# Patient Record
Sex: Female | Born: 1945
Health system: Southern US, Community
[De-identification: ages and names within clinical notes are randomized; demographics above are authoritative.]

## PROBLEM LIST (undated history)

## (undated) DIAGNOSIS — N83201 Unspecified ovarian cyst, right side: Secondary | ICD-10-CM

## (undated) DIAGNOSIS — Z8742 Personal history of other diseases of the female genital tract: Secondary | ICD-10-CM

## (undated) DIAGNOSIS — D649 Anemia, unspecified: Secondary | ICD-10-CM

## (undated) DIAGNOSIS — N83202 Unspecified ovarian cyst, left side: Secondary | ICD-10-CM

## (undated) DIAGNOSIS — T4145XA Adverse effect of unspecified anesthetic, initial encounter: Secondary | ICD-10-CM

## (undated) DIAGNOSIS — M1990 Unspecified osteoarthritis, unspecified site: Secondary | ICD-10-CM

## (undated) DIAGNOSIS — Z9889 Other specified postprocedural states: Secondary | ICD-10-CM

## (undated) DIAGNOSIS — N201 Calculus of ureter: Secondary | ICD-10-CM

## (undated) DIAGNOSIS — Z87442 Personal history of urinary calculi: Secondary | ICD-10-CM

## (undated) DIAGNOSIS — T8859XA Other complications of anesthesia, initial encounter: Secondary | ICD-10-CM

## (undated) DIAGNOSIS — R112 Nausea with vomiting, unspecified: Secondary | ICD-10-CM

## (undated) HISTORY — PX: APPENDECTOMY: SHX54

## (undated) HISTORY — PX: HERNIA REPAIR: SHX51

## (undated) HISTORY — PX: CATARACT EXTRACTION, BILATERAL: SHX1313

## (undated) HISTORY — PX: WISDOM TOOTH EXTRACTION: SHX21

## (undated) HISTORY — PX: LAPAROSCOPY: SHX197

## (undated) HISTORY — PX: BLEPHAROPLASTY: SUR158

## (undated) HISTORY — PX: KNEE ARTHROSCOPY W/ MENISCAL REPAIR: SHX1877

---

## 2000-10-07 ENCOUNTER — Other Ambulatory Visit: Admission: RE | Admit: 2000-10-07 | Discharge: 2000-10-07 | Payer: Self-pay | Admitting: Obstetrics and Gynecology

## 2001-10-12 ENCOUNTER — Other Ambulatory Visit: Admission: RE | Admit: 2001-10-12 | Discharge: 2001-10-12 | Payer: Self-pay | Admitting: Obstetrics and Gynecology

## 2002-10-19 ENCOUNTER — Other Ambulatory Visit: Admission: RE | Admit: 2002-10-19 | Discharge: 2002-10-19 | Payer: Self-pay | Admitting: Obstetrics and Gynecology

## 2003-10-23 ENCOUNTER — Other Ambulatory Visit: Admission: RE | Admit: 2003-10-23 | Discharge: 2003-10-23 | Payer: Self-pay | Admitting: Obstetrics and Gynecology

## 2005-03-11 ENCOUNTER — Other Ambulatory Visit: Admission: RE | Admit: 2005-03-11 | Discharge: 2005-03-11 | Payer: Self-pay | Admitting: Obstetrics and Gynecology

## 2011-06-04 ENCOUNTER — Telehealth: Payer: Self-pay | Admitting: *Deleted

## 2011-06-04 NOTE — Telephone Encounter (Signed)
Opened in Error.

## 2013-02-28 ENCOUNTER — Other Ambulatory Visit: Payer: Self-pay | Admitting: Orthopedic Surgery

## 2013-03-03 NOTE — Pre-Procedure Instructions (Signed)
Veronica Phelps  03/03/2013   Your procedure is scheduled on:  Monday, February 16.  Report to [facility], Main Entrance [name] "[facility]" at 8:00 AM.  Call this number if you have problems the morning of surgery: [phone]   Remember:   Do not eat food or drink liquids after midnight Sunday, February 15.   Take these medicines the morning of surgery with A SIP OF WATER: -   Do not wear jewelry, make-up or nail polish.  Do not wear lotions, powders, or perfumes. You may wear deodorant.  Do not shave 48 hours prior to surgery.   Do not bring valuables to the hospital.  [facility] is not responsible for any belongings or valuables.               Contacts, dentures or bridgework may not be worn into surgery.  Leave suitcase in the car. After surgery it may be brought to your room.  For patients admitted to the hospital, discharge time is determined by your treatment team.                Special Instructions: Shower with CHG wash (Bactoshield) tonight and again in the am prior to arriving to hospital.   Please read over the following fact sheets that you were given: Pain Booklet, Coughing and Deep Breathing, Blood Transfusion Information and Surgical Site Infection Prevention

## 2013-03-04 ENCOUNTER — Encounter (HOSPITAL_COMMUNITY)
Admission: RE | Admit: 2013-03-04 | Discharge: 2013-03-04 | Disposition: A | Discharge status: Home / Self Care | Payer: Medicare Other | Source: Ambulatory Visit | Attending: Orthopedic Surgery | Admitting: Orthopedic Surgery

## 2013-03-04 ENCOUNTER — Encounter (HOSPITAL_COMMUNITY): Payer: Self-pay | Admitting: Pharmacy Technician

## 2013-03-04 ENCOUNTER — Encounter (HOSPITAL_COMMUNITY): Payer: Self-pay

## 2013-03-04 DIAGNOSIS — Z01818 Encounter for other preprocedural examination: Secondary | ICD-10-CM | POA: Insufficient documentation

## 2013-03-04 DIAGNOSIS — Z01812 Encounter for preprocedural laboratory examination: Secondary | ICD-10-CM | POA: Insufficient documentation

## 2013-03-04 HISTORY — DX: Other complications of anesthesia, initial encounter: T88.59XA

## 2013-03-04 HISTORY — DX: Adverse effect of unspecified anesthetic, initial encounter: T41.45XA

## 2013-03-04 HISTORY — DX: Unspecified osteoarthritis, unspecified site: M19.90

## 2013-03-04 LAB — COMPREHENSIVE METABOLIC PANEL
ALK PHOS: 100 U/L (ref 39–117)
ALT: 67 U/L — ABNORMAL HIGH (ref 0–35)
AST: 45 U/L — ABNORMAL HIGH (ref 0–37)
Albumin: 3.9 g/dL (ref 3.5–5.2)
BUN: 21 mg/dL (ref 6–23)
CHLORIDE: 104 meq/L (ref 96–112)
CO2: 24 mEq/L (ref 19–32)
CREATININE: 0.64 mg/dL (ref 0.50–1.10)
Calcium: 9.4 mg/dL (ref 8.4–10.5)
GFR calc non Af Amer: >90 mL/min (ref >90)
GLUCOSE: 80 mg/dL (ref 70–99)
POTASSIUM: 4 meq/L (ref 3.7–5.3)
Sodium: 142 mEq/L (ref 137–147)
Total Bilirubin: 0.3 mg/dL (ref 0.3–1.2)
Total Protein: 7.2 g/dL (ref 6.0–8.3)

## 2013-03-04 LAB — APTT: aPTT: 30 seconds (ref 24–37)

## 2013-03-04 LAB — CBC WITH DIFFERENTIAL/PLATELET
Basophils Absolute: 0 10*3/uL (ref 0.0–0.1)
Basophils Relative: 0 % (ref 0–1)
EOS ABS: 0.3 10*3/uL (ref 0.0–0.7)
Eosinophils Relative: 3 % (ref 0–5)
HEMATOCRIT: 42.9 % (ref 36.0–46.0)
HEMOGLOBIN: 15 g/dL (ref 12.0–15.0)
LYMPHS ABS: 1.6 10*3/uL (ref 0.7–4.0)
Lymphocytes Relative: 21 % (ref 12–46)
MCH: 32.9 pg (ref 26.0–34.0)
MCHC: 35 g/dL (ref 30.0–36.0)
MCV: 94.1 fL (ref 78.0–100.0)
MONOS PCT: 6 % (ref 3–12)
Monocytes Absolute: 0.5 10*3/uL (ref 0.1–1.0)
NEUTROS ABS: 5.3 10*3/uL (ref 1.7–7.7)
NEUTROS PCT: 69 % (ref 43–77)
Platelets: 266 10*3/uL (ref 150–400)
RBC: 4.56 MIL/uL (ref 3.87–5.11)
RDW: 13.2 % (ref 11.5–15.5)
WBC: 7.6 10*3/uL (ref 4.0–10.5)

## 2013-03-04 LAB — TYPE AND SCREEN
ABO/RH(D): B POS
Antibody Screen: NEGATIVE

## 2013-03-04 LAB — URINALYSIS, ROUTINE W REFLEX MICROSCOPIC
Bilirubin Urine: NEGATIVE
Glucose, UA: NEGATIVE mg/dL
HGB URINE DIPSTICK: NEGATIVE
KETONES UR: NEGATIVE mg/dL
NITRITE: NEGATIVE
PROTEIN: NEGATIVE mg/dL
Specific Gravity, Urine: 1.023 (ref 1.005–1.030)
Urobilinogen, UA: 0.2 mg/dL (ref 0.0–1.0)
pH: 5 (ref 5.0–8.0)

## 2013-03-04 LAB — SURGICAL PCR SCREEN
MRSA, PCR: NEGATIVE
Staphylococcus aureus: NEGATIVE

## 2013-03-04 LAB — PROTIME-INR
INR: 0.88 (ref 0.00–1.49)
PROTHROMBIN TIME: 11.8 s (ref 11.6–15.2)

## 2013-03-04 LAB — URINE MICROSCOPIC-ADD ON

## 2013-03-04 LAB — ABO/RH: ABO/RH(D): B POS

## 2013-03-06 LAB — URINE CULTURE: Colony Count: >=100000

## 2013-03-07 NOTE — Progress Notes (Signed)
Anesthesia Chart Review:  Patient is a 68 year old female scheduled for left TKA on 03/14/13 by Dr. Ronnie Derby.  History includes arthritis, non-smoker. She reported that it takes a while for her to awaken from anesthesia.  PCP is Dr. Melinda Crutch with [facility].  CXR on 03/04/13 showed no active cardiopulmonary disease.  Preoperative labs noted.  AST/ALT 45/67.  Results are less than two times above normal and PLT count and PT/PTT WNL, so I will not plan to repeat preoperatively.  Tylenol is on her med list.  She denied ETOH use and did not report a history of hepatitis.  EKG is still pending from [facility].  Short Stay nursing staff to follow-up and have me review if abnormal; otherwise I anticipate that she can proceed as planned.  Myra Gianotti, PA-C [facility] Short Stay Center/Anesthesiology Phone [phone] 03/07/2013 1:32 PM

## 2013-03-13 MED ORDER — BUPIVACAINE LIPOSOME 1.3 % IJ SUSP
20.0000 mL | Freq: Once | INTRAMUSCULAR | Status: DC
  Filled 2013-03-13: qty 20

## 2013-03-13 MED ORDER — CHLORHEXIDINE GLUCONATE 4 % EX LIQD
60.0000 mL | Freq: Once | CUTANEOUS | Status: DC
  Filled 2013-03-13: qty 60

## 2013-03-13 MED ORDER — TRANEXAMIC ACID 100 MG/ML IV SOLN
1000.0000 mg | INTRAVENOUS | Status: AC
  Administered 2013-03-14: 1000 mg via INTRAVENOUS
  Filled 2013-03-13: qty 10

## 2013-03-13 MED ORDER — CEFAZOLIN SODIUM-DEXTROSE 2-3 GM-% IV SOLR
2.0000 g | INTRAVENOUS | Status: AC
  Administered 2013-03-14: 2 g via INTRAVENOUS
  Filled 2013-03-13: qty 50

## 2013-03-13 MED ORDER — SODIUM CHLORIDE 0.9 % IV SOLN: INTRAVENOUS | Status: DC

## 2013-03-14 ENCOUNTER — Encounter (HOSPITAL_COMMUNITY): Payer: Self-pay | Admitting: Anesthesiology

## 2013-03-14 ENCOUNTER — Encounter (HOSPITAL_COMMUNITY): Payer: Medicare Other | Admitting: Vascular Surgery

## 2013-03-14 ENCOUNTER — Inpatient Hospital Stay (HOSPITAL_COMMUNITY)
Admission: RE | Admit: 2013-03-14 | Discharge: 2013-03-15 | DRG: 470 | Disposition: A | Discharge status: Home Health | Payer: Medicare Other | Source: Ambulatory Visit | Attending: Orthopedic Surgery | Admitting: Orthopedic Surgery

## 2013-03-14 ENCOUNTER — Encounter (HOSPITAL_COMMUNITY)
Admission: RE | Disposition: A | Discharge status: Home Health | Payer: Self-pay | Source: Ambulatory Visit | Attending: Orthopedic Surgery

## 2013-03-14 ENCOUNTER — Inpatient Hospital Stay (HOSPITAL_COMMUNITY): Payer: Medicare Other | Admitting: Anesthesiology

## 2013-03-14 ENCOUNTER — Other Ambulatory Visit: Payer: Self-pay | Admitting: Orthopedic Surgery

## 2013-03-14 DIAGNOSIS — Z888 Allergy status to other drugs, medicaments and biological substances status: Secondary | ICD-10-CM

## 2013-03-14 DIAGNOSIS — D62 Acute posthemorrhagic anemia: Secondary | ICD-10-CM | POA: Diagnosis not present

## 2013-03-14 DIAGNOSIS — Z96659 Presence of unspecified artificial knee joint: Secondary | ICD-10-CM

## 2013-03-14 DIAGNOSIS — M1710 Unilateral primary osteoarthritis, unspecified knee: Secondary | ICD-10-CM | POA: Diagnosis present

## 2013-03-14 DIAGNOSIS — M25569 Pain in unspecified knee: Secondary | ICD-10-CM | POA: Diagnosis present

## 2013-03-14 HISTORY — PX: TOTAL KNEE ARTHROPLASTY: SHX125

## 2013-03-14 LAB — CREATININE, SERUM
Creatinine, Ser: 0.69 mg/dL (ref 0.50–1.10)
GFR calc Af Amer: >90 mL/min (ref >90)
GFR calc non Af Amer: 89 mL/min — ABNORMAL LOW (ref >90)

## 2013-03-14 LAB — CBC
HEMATOCRIT: 39.4 % (ref 36.0–46.0)
Hemoglobin: 13.5 g/dL (ref 12.0–15.0)
MCH: 32.2 pg (ref 26.0–34.0)
MCHC: 34.3 g/dL (ref 30.0–36.0)
MCV: 94 fL (ref 78.0–100.0)
Platelets: 290 10*3/uL (ref 150–400)
RBC: 4.19 MIL/uL (ref 3.87–5.11)
RDW: 13.2 % (ref 11.5–15.5)
WBC: 12 10*3/uL — ABNORMAL HIGH (ref 4.0–10.5)

## 2013-03-14 SURGERY — ARTHROPLASTY, KNEE, TOTAL
Anesthesia: General | Site: Knee | Laterality: Left

## 2013-03-14 MED ORDER — ENOXAPARIN SODIUM 30 MG/0.3ML ~~LOC~~ SOLN
30.0000 mg | Freq: Two times a day (BID) | SUBCUTANEOUS | Status: DC
  Administered 2013-03-15: 30 mg via SUBCUTANEOUS
  Filled 2013-03-14 (×3): qty 0.3

## 2013-03-14 MED ORDER — ACETAMINOPHEN 325 MG PO TABS: 650.0000 mg | ORAL_TABLET | Freq: Four times a day (QID) | ORAL | Status: DC | PRN

## 2013-03-14 MED ORDER — MENTHOL 3 MG MT LOZG: 1.0000 | LOZENGE | OROMUCOSAL | Status: DC | PRN

## 2013-03-14 MED ORDER — DEXTROSE 5 % IV SOLN
500.0000 mg | Freq: Four times a day (QID) | INTRAVENOUS | Status: DC | PRN
  Filled 2013-03-14: qty 5

## 2013-03-14 MED ORDER — METOCLOPRAMIDE HCL 10 MG PO TABS: 5.0000 mg | ORAL_TABLET | Freq: Three times a day (TID) | ORAL | Status: DC | PRN

## 2013-03-14 MED ORDER — BUPIVACAINE-EPINEPHRINE (PF) 0.5% -1:200000 IJ SOLN
INTRAMUSCULAR | Status: AC
  Filled 2013-03-14: qty 10

## 2013-03-14 MED ORDER — CELECOXIB 200 MG PO CAPS
200.0000 mg | ORAL_CAPSULE | Freq: Two times a day (BID) | ORAL | Status: DC
  Administered 2013-03-14 – 2013-03-15 (×2): 200 mg via ORAL
  Filled 2013-03-14 (×3): qty 1

## 2013-03-14 MED ORDER — METHOCARBAMOL 500 MG PO TABS
500.0000 mg | ORAL_TABLET | Freq: Four times a day (QID) | ORAL | Status: DC | PRN
  Administered 2013-03-14 – 2013-03-15 (×2): 500 mg via ORAL
  Filled 2013-03-14 (×2): qty 1

## 2013-03-14 MED ORDER — ALUM & MAG HYDROXIDE-SIMETH 200-200-20 MG/5ML PO SUSP: 30.0000 mL | ORAL | Status: DC | PRN

## 2013-03-14 MED ORDER — PHENOL 1.4 % MT LIQD: 1.0000 | OROMUCOSAL | Status: DC | PRN

## 2013-03-14 MED ORDER — CHLORHEXIDINE GLUCONATE 4 % EX LIQD
60.0000 mL | Freq: Once | CUTANEOUS | Status: DC
  Filled 2013-03-14: qty 60

## 2013-03-14 MED ORDER — HYDROCODONE-ACETAMINOPHEN 10-325 MG PO TABS
1.0000 | ORAL_TABLET | ORAL | Status: DC | PRN
  Administered 2013-03-14: 1 via ORAL
  Administered 2013-03-15: 2 via ORAL
  Administered 2013-03-15: 1 via ORAL
  Filled 2013-03-14: qty 1
  Filled 2013-03-14: qty 2
  Filled 2013-03-14: qty 1

## 2013-03-14 MED ORDER — HYDROMORPHONE HCL PF 1 MG/ML IJ SOLN
0.2500 mg | INTRAMUSCULAR | Status: DC | PRN
  Administered 2013-03-14 (×2): 0.25 mg via INTRAVENOUS

## 2013-03-14 MED ORDER — PROPOFOL 10 MG/ML IV BOLUS
INTRAVENOUS | Status: AC
  Filled 2013-03-14: qty 20

## 2013-03-14 MED ORDER — PROPOFOL 10 MG/ML IV BOLUS
INTRAVENOUS | Status: DC | PRN
  Administered 2013-03-14: 200 mg via INTRAVENOUS

## 2013-03-14 MED ORDER — DOCUSATE SODIUM 100 MG PO CAPS
100.0000 mg | ORAL_CAPSULE | Freq: Two times a day (BID) | ORAL | Status: DC
  Administered 2013-03-14 – 2013-03-15 (×2): 100 mg via ORAL
  Filled 2013-03-14 (×2): qty 1

## 2013-03-14 MED ORDER — ACETAMINOPHEN 650 MG RE SUPP: 650.0000 mg | Freq: Four times a day (QID) | RECTAL | Status: DC | PRN

## 2013-03-14 MED ORDER — SENNOSIDES-DOCUSATE SODIUM 8.6-50 MG PO TABS: 1.0000 | ORAL_TABLET | Freq: Every evening | ORAL | Status: DC | PRN

## 2013-03-14 MED ORDER — BUPIVACAINE LIPOSOME 1.3 % IJ SUSP
INTRAMUSCULAR | Status: DC | PRN
  Administered 2013-03-14: 20 mL

## 2013-03-14 MED ORDER — SODIUM CHLORIDE 0.9 % IV SOLN
INTRAVENOUS | Status: DC
  Administered 2013-03-14: 75 mL/h via INTRAVENOUS
  Administered 2013-03-15: 06:00:00 via INTRAVENOUS

## 2013-03-14 MED ORDER — HYDROMORPHONE HCL PF 1 MG/ML IJ SOLN
INTRAMUSCULAR | Status: AC
  Administered 2013-03-14: 0.25 mg via INTRAVENOUS
  Filled 2013-03-14: qty 1

## 2013-03-14 MED ORDER — DIPHENHYDRAMINE HCL 12.5 MG/5ML PO ELIX: 12.5000 mg | ORAL_SOLUTION | ORAL | Status: DC | PRN

## 2013-03-14 MED ORDER — SODIUM CHLORIDE 0.9 % IR SOLN
Status: DC | PRN
  Administered 2013-03-14 (×2): 1000 mL

## 2013-03-14 MED ORDER — ONDANSETRON HCL 4 MG PO TABS: 4.0000 mg | ORAL_TABLET | Freq: Four times a day (QID) | ORAL | Status: DC | PRN

## 2013-03-14 MED ORDER — HYDROMORPHONE HCL PF 1 MG/ML IJ SOLN
1.0000 mg | INTRAMUSCULAR | Status: DC | PRN
  Administered 2013-03-14 (×2): 1 mg via INTRAVENOUS
  Filled 2013-03-14 (×2): qty 1

## 2013-03-14 MED ORDER — FENTANYL CITRATE 0.05 MG/ML IJ SOLN
INTRAMUSCULAR | Status: AC
  Administered 2013-03-14: 100 ug
  Filled 2013-03-14: qty 2

## 2013-03-14 MED ORDER — BISACODYL 5 MG PO TBEC: 5.0000 mg | DELAYED_RELEASE_TABLET | Freq: Every day | ORAL | Status: DC | PRN

## 2013-03-14 MED ORDER — ONDANSETRON HCL 4 MG/2ML IJ SOLN
4.0000 mg | Freq: Four times a day (QID) | INTRAMUSCULAR | Status: DC | PRN
  Administered 2013-03-14 – 2013-03-15 (×3): 4 mg via INTRAVENOUS
  Filled 2013-03-14 (×3): qty 2

## 2013-03-14 MED ORDER — ONDANSETRON HCL 4 MG/2ML IJ SOLN
INTRAMUSCULAR | Status: DC | PRN
  Administered 2013-03-14: 4 mg via INTRAVENOUS

## 2013-03-14 MED ORDER — FENTANYL CITRATE 0.05 MG/ML IJ SOLN
INTRAMUSCULAR | Status: AC
  Filled 2013-03-14: qty 5

## 2013-03-14 MED ORDER — FLEET ENEMA 7-19 GM/118ML RE ENEM: 1.0000 | ENEMA | Freq: Once | RECTAL | Status: AC | PRN

## 2013-03-14 MED ORDER — METOCLOPRAMIDE HCL 5 MG/ML IJ SOLN
5.0000 mg | Freq: Three times a day (TID) | INTRAMUSCULAR | Status: DC | PRN
  Administered 2013-03-14: 10 mg via INTRAVENOUS
  Filled 2013-03-14: qty 2

## 2013-03-14 MED ORDER — LACTATED RINGERS IV SOLN
INTRAVENOUS | Status: DC | PRN
  Administered 2013-03-14 (×2): via INTRAVENOUS

## 2013-03-14 MED ORDER — LIDOCAINE HCL (CARDIAC) 20 MG/ML IV SOLN
INTRAVENOUS | Status: DC | PRN
  Administered 2013-03-14: 80 mg via INTRAVENOUS

## 2013-03-14 MED ORDER — LACTATED RINGERS IV SOLN
INTRAVENOUS | Status: DC
  Administered 2013-03-14: 08:00:00 via INTRAVENOUS

## 2013-03-14 MED ORDER — FENTANYL CITRATE 0.05 MG/ML IJ SOLN
INTRAMUSCULAR | Status: DC | PRN
  Administered 2013-03-14: 50 ug via INTRAVENOUS
  Administered 2013-03-14: 125 ug via INTRAVENOUS
  Administered 2013-03-14 (×3): 25 ug via INTRAVENOUS

## 2013-03-14 MED ORDER — CEFAZOLIN SODIUM-DEXTROSE 2-3 GM-% IV SOLR
2.0000 g | Freq: Four times a day (QID) | INTRAVENOUS | Status: AC
  Administered 2013-03-14 (×2): 2 g via INTRAVENOUS
  Filled 2013-03-14 (×2): qty 50

## 2013-03-14 MED ORDER — INFLUENZA VAC SPLIT QUAD 0.5 ML IM SUSP
0.5000 mL | INTRAMUSCULAR | Status: DC
  Filled 2013-03-14: qty 0.5

## 2013-03-14 MED ORDER — ONDANSETRON HCL 4 MG/2ML IJ SOLN: 4.0000 mg | Freq: Once | INTRAMUSCULAR | Status: DC | PRN

## 2013-03-14 MED ORDER — BUPIVACAINE-EPINEPHRINE 0.5% -1:200000 IJ SOLN
INTRAMUSCULAR | Status: DC | PRN
  Administered 2013-03-14: 30 mL

## 2013-03-14 SURGICAL SUPPLY — 55 items
BANDAGE ESMARK 6X9 LF (GAUZE/BANDAGES/DRESSINGS) ×1 IMPLANT
BLADE SAGITTAL 13X1.27X60 (BLADE) ×2 IMPLANT
BLADE SAGITTAL 13X1.27X60MM (BLADE) ×1
BLADE SAW SGTL 83.5X18.5 (BLADE) ×3 IMPLANT
BNDG CMPR 9X6 STRL LF SNTH (GAUZE/BANDAGES/DRESSINGS) ×1
BNDG ESMARK 6X9 LF (GAUZE/BANDAGES/DRESSINGS) ×3
BOWL SMART MIX CTS (DISPOSABLE) ×3 IMPLANT
CAP KNEE CMT PERSONA ×2 IMPLANT
CEMENT BONE SIMPLEX SPEEDSET (Cement) ×6 IMPLANT
COVER SURGICAL LIGHT HANDLE (MISCELLANEOUS) ×3 IMPLANT
CUFF TOURNIQUET SINGLE 34IN LL (TOURNIQUET CUFF) ×3 IMPLANT
DRAPE EXTREMITY T 121X128X90 (DRAPE) ×3 IMPLANT
DRAPE INCISE IOBAN 66X45 STRL (DRAPES) ×6 IMPLANT
DRAPE PROXIMA HALF (DRAPES) ×3 IMPLANT
DRAPE U-SHAPE 47X51 STRL (DRAPES) ×3 IMPLANT
DRSG ADAPTIC 3X8 NADH LF (GAUZE/BANDAGES/DRESSINGS) ×3 IMPLANT
DRSG PAD ABDOMINAL 8X10 ST (GAUZE/BANDAGES/DRESSINGS) ×3 IMPLANT
DURAPREP 26ML APPLICATOR (WOUND CARE) ×6 IMPLANT
ELECT REM PT RETURN 9FT ADLT (ELECTROSURGICAL) ×3
ELECTRODE REM PT RTRN 9FT ADLT (ELECTROSURGICAL) ×1 IMPLANT
EVACUATOR 1/8 PVC DRAIN (DRAIN) ×3 IMPLANT
GLOVE BIOGEL M 7.0 STRL (GLOVE) IMPLANT
GLOVE BIOGEL PI IND STRL 7.5 (GLOVE) IMPLANT
GLOVE BIOGEL PI IND STRL 8.5 (GLOVE) ×2 IMPLANT
GLOVE BIOGEL PI INDICATOR 7.5 (GLOVE)
GLOVE BIOGEL PI INDICATOR 8.5 (GLOVE) ×4
GLOVE SURG ORTHO 8.0 STRL STRW (GLOVE) ×6 IMPLANT
GOWN PREVENTION PLUS XLARGE (GOWN DISPOSABLE) ×6 IMPLANT
GOWN STRL NON-REIN LRG LVL3 (GOWN DISPOSABLE) ×6 IMPLANT
HANDPIECE INTERPULSE COAX TIP (DISPOSABLE) ×3
HOOD PEEL AWAY FACE SHEILD DIS (HOOD) ×12 IMPLANT
KIT BASIN OR (CUSTOM PROCEDURE TRAY) ×3 IMPLANT
KIT ROOM TURNOVER OR (KITS) ×3 IMPLANT
MANIFOLD NEPTUNE II (INSTRUMENTS) ×3 IMPLANT
NEEDLE 22X1 1/2 (OR ONLY) (NEEDLE) ×3 IMPLANT
NS IRRIG 1000ML POUR BTL (IV SOLUTION) ×3 IMPLANT
PACK TOTAL JOINT (CUSTOM PROCEDURE TRAY) ×3 IMPLANT
PAD ARMBOARD 7.5X6 YLW CONV (MISCELLANEOUS) ×6 IMPLANT
PADDING CAST COTTON 6X4 STRL (CAST SUPPLIES) ×3 IMPLANT
SET HNDPC FAN SPRY TIP SCT (DISPOSABLE) ×1 IMPLANT
SPONGE GAUZE 4X4 12PLY (GAUZE/BANDAGES/DRESSINGS) ×3 IMPLANT
SPONGE GAUZE 4X4 12PLY STER LF (GAUZE/BANDAGES/DRESSINGS) ×3 IMPLANT
STAPLER VISISTAT 35W (STAPLE) ×3 IMPLANT
SUCTION FRAZIER TIP 10 FR DISP (SUCTIONS) ×3 IMPLANT
SUT BONE WAX W31G (SUTURE) ×3 IMPLANT
SUT VIC AB 0 CTB1 27 (SUTURE) ×6 IMPLANT
SUT VIC AB 1 CT1 27 (SUTURE) ×9
SUT VIC AB 1 CT1 27XBRD ANBCTR (SUTURE) ×2 IMPLANT
SUT VIC AB 2-0 CT1 27 (SUTURE) ×6
SUT VIC AB 2-0 CT1 TAPERPNT 27 (SUTURE) ×2 IMPLANT
SYR CONTROL 10ML LL (SYRINGE) ×3 IMPLANT
TOWEL OR 17X24 6PK STRL BLUE (TOWEL DISPOSABLE) ×3 IMPLANT
TOWEL OR 17X26 10 PK STRL BLUE (TOWEL DISPOSABLE) ×3 IMPLANT
TRAY FOLEY CATH 14FR (SET/KITS/TRAYS/PACK) ×3 IMPLANT
WATER STERILE IRR 1000ML POUR (IV SOLUTION) ×6 IMPLANT

## 2013-03-14 NOTE — H&P (Signed)
  Veronica Phelps MRN:  [id number] DOB/SEX:  11/17/1945/female  CHIEF COMPLAINT:  Painful left Knee  HISTORY: Patient is a 68 y.o. female presented with a history of pain in the left knee. Onset of symptoms was gradual starting several years ago with gradually worsening course since that time. Prior procedures on the knee include arthroscopy. Patient has been treated conservatively with over-the-counter NSAIDs and activity modification. Patient currently rates pain in the knee at 9 out of 10 with activity. There is no pain at night.  PAST MEDICAL HISTORY: There are no active problems to display for this patient.  Past Medical History  Diagnosis Date  . Complication of anesthesia     takes awhile to wake up   . Arthritis     oa   Past Surgical History  Procedure Laterality Date  . Hernia repair      1970s x2   . Laparoscopy    . Knee arthroscopy w/ meniscal repair      left    11/2011     MEDICATIONS:   No prescriptions prior to admission    ALLERGIES:   Allergies  Allergen Reactions  . Oxycontin [Oxycodone Hcl] Other (See Comments)    Dizziness  . Ultram [Tramadol] Other (See Comments)    Dizziness, and headaches    REVIEW OF SYSTEMS:  Pertinent items are noted in HPI.   FAMILY HISTORY:  No family history on file.  SOCIAL HISTORY:   History  Substance Use Topics  . Smoking status: Never Smoker   . Smokeless tobacco: Not on file  . Alcohol Use: No     EXAMINATION:  Vital signs in last 24 hours:    General appearance: alert, cooperative and no distress Lungs: clear to auscultation bilaterally Heart: regular rate and rhythm, S1, S2 normal, no murmur, click, rub or gallop Abdomen: soft, non-tender; bowel sounds normal; no masses,  no organomegaly Extremities: extremities normal, atraumatic, no cyanosis or edema and Homans sign is negative, no sign of DVT Pulses: 2+ and symmetric  Musculoskeletal:  ROM 0-110, Ligaments intact,  Imaging Review Plain  radiographs demonstrate severe degenerative joint disease of the left knee. The overall alignment is mild valgus. The bone quality appears to be good for age and reported activity level.  Assessment/Plan: End stage arthritis, left knee   The patient history, physical examination and imaging studies are consistent with advanced degenerative joint disease of the left knee. The patient has failed conservative treatment.  The clearance notes were reviewed.  After discussion with the patient it was felt that Total Knee Replacement was indicated. The procedure,  risks, and benefits of total knee arthroplasty were presented and reviewed. The risks including but not limited to aseptic loosening, infection, blood clots, vascular injury, stiffness, patella tracking problems complications among others were discussed. The patient acknowledged the explanation, agreed to proceed with the plan.  Veronica Phelps 03/14/2013, 7:07 AM

## 2013-03-14 NOTE — Anesthesia Procedure Notes (Signed)
Anesthesia Regional Block:  Femoral nerve block  Pre-Anesthetic Checklist: ,, timeout performed, Correct Patient, Correct Site, Correct Laterality, Correct Procedure, Correct Position, site marked, Risks and benefits discussed,  Surgical consent,  Pre-op evaluation,  At surgeon's request and post-op pain management  Laterality: Left  Prep: Maximum Sterile Barrier Precautions used, chloraprep and alcohol swabs       Needles:  Injection technique: Single-shot  Needle Type: Stimulator Needle - 80          Additional Needles:  Procedures: nerve stimulator Femoral nerve block  Nerve Stimulator or Paresthesia:  Response: 0.5 mA, 0.1 ms, 5 cm  Additional Responses:   Narrative:  Start time: 03/14/2013 9:10 AM End time: 03/14/2013 9:15 AM Injection made incrementally with aspirations every 5 mL.  Performed by: Personally  Anesthesiologist: Sharolyn Douglas MD  Additional Notes: Pt accepts risks w/ the procedure. 0.5% Marcaine w/ epi w/o difficulty. Pt tolerated well. GES

## 2013-03-14 NOTE — Preoperative (Signed)
Beta Blockers   Reason not to administer Beta Blockers:Not Applicable 

## 2013-03-14 NOTE — Anesthesia Postprocedure Evaluation (Signed)
  Anesthesia Post-op Note  Patient: Veronica Phelps  Procedure(s) Performed: Procedure(s): LEFT TOTAL KNEE ARTHROPLASTY (Left)  Patient Location: PACU  Anesthesia Type:General and GA combined with regional for post-op pain  Level of Consciousness: awake, alert  and oriented  Airway and Oxygen Therapy: Patient Spontanous Breathing  Post-op Pain: mild  Post-op Assessment: Post-op Vital signs reviewed, Patient's Cardiovascular Status Stable, Respiratory Function Stable, Patent Airway and Pain level controlled  Post-op Vital Signs: stable  Complications: No apparent anesthesia complications

## 2013-03-14 NOTE — Care Management Note (Signed)
CARE MANAGEMENT NOTE 03/14/2013  Patient:  Veronica Phelps, Veronica Phelps   Account Number:  [id number]  Date Initiated:  03/14/2013  Documentation initiated by:  Ricki Miller  Subjective/Objective Assessment:   68 yr old female s/p left total knee arthroplasty.     Action/Plan:   PT/OT eval   Anticipated DC Date:  03/15/2013   Anticipated DC Plan:  [facility]         Choice offered to / List presented to:             Status of service:  In process, will continue to follow

## 2013-03-14 NOTE — Transfer of Care (Signed)
Immediate Anesthesia Transfer of Care Note  Patient: Veronica Phelps  Procedure(s) Performed: Procedure(s): LEFT TOTAL KNEE ARTHROPLASTY (Left)  Patient Location: PACU  Anesthesia Type:General  Level of Consciousness: awake and alert   Airway & Oxygen Therapy: Patient Spontanous Breathing and Patient connected to nasal cannula oxygen  Post-op Assessment: Report given to PACU RN, Post -op Vital signs reviewed and stable and Patient moving all extremities X 4  Post vital signs: Reviewed and stable  Complications: No apparent anesthesia complications

## 2013-03-14 NOTE — Anesthesia Preprocedure Evaluation (Signed)
Anesthesia Evaluation  Patient identified by MRN, date of birth, ID band Patient awake    Reviewed: Allergy & Precautions, H&P , NPO status , Patient's Chart, lab work & pertinent test results  Airway       Dental   Pulmonary          Cardiovascular     Neuro/Psych    GI/Hepatic   Endo/Other    Renal/GU      Musculoskeletal   Abdominal   Peds  Hematology   Anesthesia Other Findings   Reproductive/Obstetrics                           Anesthesia Physical Anesthesia Plan  ASA: I  Anesthesia Plan: General   Post-op Pain Management:    Induction: Intravenous  Airway Management Planned: LMA and Oral ETT  Additional Equipment:   Intra-op Plan:   Post-operative Plan: Extubation in OR  Informed Consent: I have reviewed the patients History and Physical, chart, labs and discussed the procedure including the risks, benefits and alternatives for the proposed anesthesia with the patient or authorized representative who has indicated his/her understanding and acceptance.     Plan Discussed with:   Anesthesia Plan Comments:         Anesthesia Quick Evaluation

## 2013-03-14 NOTE — Progress Notes (Signed)
Orthopedic Tech Progress Note Patient Details:  Veronica Phelps [birth year] [id number]  CPM Left Knee CPM Left Knee: On Left Knee Flexion (Degrees): 90 Left Knee Extension (Degrees): 0 Additional Comments: Trapeze bar and foot roll   Irish Elders 03/14/2013, 12:27 PM

## 2013-03-14 NOTE — Evaluation (Signed)
Physical Therapy Evaluation Patient Details Name: Veronica Phelps MRN: [id number] DOB: November 01, 1945 Today's Date: 03/14/2013 Time: 1324-4010 PT Time Calculation (min): 34 min  PT Assessment / Plan / Recommendation History of Present Illness  Pt is a 68 y/o female admitted s/p L TKA.  Clinical Impression  This patient presents with acute pain and decreased functional independence following the above mentioned procedure. At the time of PT eval, pt nauseated while sitting EOB and reports feeling shaky. Pt with 3 knee buckles while standing throughout session, and recliner was pulled up behind her rather than attempt ambulation at this time for safety. This patient is appropriate for skilled PT interventions to address functional limitations, improve safety and independence with functional mobility, and return to PLOF.     PT Assessment  Patient needs continued PT services    Follow Up Recommendations  Home health PT    Does the patient have the potential to tolerate intense rehabilitation      Barriers to Discharge        Equipment Recommendations  None recommended by PT    Recommendations for Other Services     Frequency 7X/week    Precautions / Restrictions Precautions Precautions: Fall;Knee Precaution Comments: Discussed towel roll under heel and NO pillow under knee.  Restrictions Weight Bearing Restrictions: No LLE Weight Bearing: Weight bearing as tolerated   Pertinent Vitals/Pain 7/10 after session. Pt declines calling RN for pain control.      Mobility  Bed Mobility Overal bed mobility: Needs Assistance Bed Mobility: Supine to Sit Supine to sit: Min assist;HOB elevated General bed mobility comments: VC's for technique and hand placement on bed rails for support. Assist for movement of LLE to EOB and for trunk stability as pt elevated to full sitting.  Transfers Overall transfer level: Needs assistance Equipment used: Rolling walker (2 wheeled) Transfers: Sit  to/from [facility] Sit to Stand: +2 physical assistance;Mod assist Stand pivot transfers: Min assist;+2 safety/equipment General transfer comment: VC's for hand placement on seated surface for safety. Assist for support for occasional knee buckle during SPT to [facility]. Pt also had knee buckle while standing from [facility] and recliner was brought behind her to sit instead of attempting steps to the recliner from the [facility].  Ambulation/Gait General Gait Details: NT due to knee instability at this time    Exercises Total Joint Exercises Ankle Circles/Pumps: 10 reps Quad Sets: 10 reps   PT Diagnosis: Difficulty walking;Acute pain  PT Problem List: Decreased strength;Decreased range of motion;Decreased activity tolerance;Decreased balance;Decreased mobility;Decreased knowledge of use of DME;Decreased safety awareness;Decreased knowledge of precautions;Pain PT Treatment Interventions: DME instruction;Gait training;Stair training;Functional mobility training;Therapeutic activities;Therapeutic exercise;Neuromuscular re-education;Patient/family education     PT Goals(Current goals can be found in the care plan section) Acute Rehab PT Goals Patient Stated Goal: To return home with husband PT Goal Formulation: With patient/family Time For Goal Achievement: 03/21/13 Potential to Achieve Goals: Good  Visit Information  Last PT Received On: 03/14/13 Assistance Needed: +2 History of Present Illness: Pt is a 68 y/o female admitted s/p L TKA.       Prior [place] expects to be discharged to:: Private residence Living Arrangements: Spouse/significant other Available Help at Discharge: Family;Available 24 hours/day Type of Home: House Home Access: Stairs to enter [employer] of Steps: 3 Entrance Stairs-Rails: Left;Right Home Layout: One level Home Equipment: Environmental consultant - 2 wheels;Bedside commode (Walk-in shower with 3-4 in lip to step over) Prior  Function Level of Independence: Independent Comments: Still  driving Communication Communication: No difficulties Dominant Hand: Left    Cognition  Cognition Arousal/Alertness: Lethargic Behavior During Therapy: Flat affect Overall Cognitive Status: Within Functional Limits for tasks assessed    Extremity/Trunk Assessment Upper Extremity Assessment Upper Extremity Assessment: Overall WFL for tasks assessed Lower Extremity Assessment Lower Extremity Assessment: LLE deficits/detail LLE Deficits / Details: Decreased strength and AROM consistent with TKA Cervical / Trunk Assessment Cervical / Trunk Assessment: Normal   Balance Balance Overall balance assessment: Needs assistance Sitting-balance support: Feet supported;Bilateral upper extremity supported Sitting balance-Leahy Scale: Fair Standing balance support: Bilateral upper extremity supported Standing balance-Leahy Scale: Poor  End of Session PT - End of Session Equipment Utilized During Treatment: Gait belt;Oxygen Activity Tolerance: Patient limited by fatigue;Patient limited by lethargy Patient left: in chair;with call bell/phone within reach;with family/visitor present Nurse Communication: Mobility status CPM Left Knee CPM Left Knee: Off Left Knee Flexion (Degrees): 90 Left Knee Extension (Degrees): 0 Additional Comments: Trapeze bar and foot roll  GP     Jolyn Lent 03/14/2013, 3:37 PM  Jolyn Lent, PT, DPT [phone]

## 2013-03-15 LAB — BASIC METABOLIC PANEL
BUN: 16 mg/dL (ref 6–23)
CALCIUM: 8.2 mg/dL — AB (ref 8.4–10.5)
CO2: 24 mEq/L (ref 19–32)
CREATININE: 0.69 mg/dL (ref 0.50–1.10)
Chloride: 102 mEq/L (ref 96–112)
GFR, EST NON AFRICAN AMERICAN: 89 mL/min — AB (ref >90)
GLUCOSE: 126 mg/dL — AB (ref 70–99)
POTASSIUM: 3.6 meq/L — AB (ref 3.7–5.3)
Sodium: 139 mEq/L (ref 137–147)

## 2013-03-15 LAB — CBC
HEMATOCRIT: 35.2 % — AB (ref 36.0–46.0)
Hemoglobin: 11.8 g/dL — ABNORMAL LOW (ref 12.0–15.0)
MCH: 31.6 pg (ref 26.0–34.0)
MCHC: 33.5 g/dL (ref 30.0–36.0)
MCV: 94.4 fL (ref 78.0–100.0)
Platelets: 274 10*3/uL (ref 150–400)
RBC: 3.73 MIL/uL — ABNORMAL LOW (ref 3.87–5.11)
RDW: 13.4 % (ref 11.5–15.5)
WBC: 8.8 10*3/uL (ref 4.0–10.5)

## 2013-03-15 MED ORDER — HYDROCODONE-ACETAMINOPHEN 10-325 MG PO TABS: 1.0000 | ORAL_TABLET | ORAL | Status: DC | PRN

## 2013-03-15 MED ORDER — ENOXAPARIN SODIUM 40 MG/0.4ML ~~LOC~~ SOLN: 40.0000 mg | SUBCUTANEOUS | Status: DC

## 2013-03-15 MED ORDER — METHOCARBAMOL 500 MG PO TABS: 500.0000 mg | ORAL_TABLET | Freq: Four times a day (QID) | ORAL | Status: DC | PRN

## 2013-03-15 MED ORDER — CELECOXIB 200 MG PO CAPS: 200.0000 mg | ORAL_CAPSULE | Freq: Two times a day (BID) | ORAL | Status: DC

## 2013-03-15 NOTE — Progress Notes (Signed)
Physical Therapy Treatment Patient Details Name: Veronica Phelps MRN: [id number] DOB: 1945-07-18 Today's Date: 03/15/2013 Time: 5366-4403 PT Time Calculation (min): 36 min  PT Assessment / Plan / Recommendation  History of Present Illness Pt is a 68 y/o female admitted s/p L TKA.   PT Comments   Pt progressing well towards physical therapy goals. Continues to demonstrate knee buckling and requires cueing for increased quad activation for safety. Will begin stair training in afternoon session.   Follow Up Recommendations  Home health PT     Does the patient have the potential to tolerate intense rehabilitation     Barriers to Discharge        Equipment Recommendations  None recommended by PT    Recommendations for Other Services    Frequency 7X/week   Progress towards PT Goals Progress towards PT goals: Progressing toward goals  Plan Current plan remains appropriate    Precautions / Restrictions Precautions Precautions: Fall;Knee Precaution Comments: Discussed towel roll under heel and NO pillow under knee.  Restrictions Weight Bearing Restrictions: Yes LLE Weight Bearing: Weight bearing as tolerated   Pertinent Vitals/Pain 3/10 at rest    Mobility  Bed Mobility Overal bed mobility: Needs Assistance Bed Mobility: Supine to Sit Supine to sit: Min assist;HOB elevated General bed mobility comments: VC's for sequencing and technique. Assist for movement of LLE to EOB, however pt was able to come into long sitting and scoot hips to transition well.  Transfers Overall transfer level: Needs assistance Equipment used: Rolling walker (2 wheeled) Transfers: Sit to/from Stand Sit to Stand: Min assist Stand pivot transfers: Min assist General transfer comment: VC's for hand placement on seated surface for safety. Assist to come to full standing, as well as for walker placement during SPT.  Ambulation/Gait Ambulation/Gait assistance: Min assist Ambulation Distance (Feet): 45  Feet Assistive device: Rolling walker (2 wheeled) Gait Pattern/deviations: Step-to pattern;Decreased stride length;Trunk flexed Gait velocity: Decreased Gait velocity interpretation: Below normal speed for age/gender General Gait Details: VC's for increased quad activation, increased heel strike, and fluidity of walker movement. Pt with one instance where knee buckled and pt required min assist to recover.     Exercises Total Joint Exercises Ankle Circles/Pumps: 10 reps Quad Sets: 10 reps Short Arc Quad: 10 reps;AAROM (Eccentric lower`) Heel Slides: 10 reps Hip ABduction/ADduction: 10 reps Goniometric ROM: 98 AROM flexion 107 AAROM flexion 5AROM extension   PT Diagnosis:    PT Problem List:   PT Treatment Interventions:     PT Goals (current goals can now be found in the care plan section) Acute Rehab PT Goals Patient Stated Goal: To return home with husband PT Goal Formulation: With patient/family Time For Goal Achievement: 03/21/13 Potential to Achieve Goals: Good  Visit Information  Last PT Received On: 03/15/13 Assistance Needed: +1 History of Present Illness: Pt is a 68 y/o female admitted s/p L TKA.    Subjective Data  Subjective: Pt agreeable to PT session and OOB.  Patient Stated Goal: To return home with husband   Cognition  Cognition Arousal/Alertness: Awake/alert Behavior During Therapy: Flat affect Overall Cognitive Status: Within Functional Limits for tasks assessed    Balance  Balance Overall balance assessment: Needs assistance Sitting-balance support: Feet supported;Bilateral upper extremity supported Sitting balance-Leahy Scale: Fair Standing balance support: Bilateral upper extremity supported Standing balance-Leahy Scale: Poor  End of Session PT - End of Session Equipment Utilized During Treatment: Gait belt Activity Tolerance: Patient tolerated treatment well Patient left: in chair;with call bell/phone within reach  Nurse Communication:  Mobility status CPM Left Knee CPM Left Knee: On   GP     Jolyn Lent 03/15/2013, 12:09 PM  Jolyn Lent, PT, DPT [phone]

## 2013-03-15 NOTE — Discharge Instructions (Signed)
Diet: As you were doing prior to hospitalization   Activity:  Increase activity slowly as tolerated                  No lifting or driving for 6 weeks  Shower:  May shower without a dressing once there is no drainage from your wound.                 Do NOT wash over the wound.                 Dressing:  You may change your dressing on Thursday                    Then change the dressing daily with sterile 4"x4"s gauze dressing                     And TED hose for knees.  Weight Bearing:  Weight bearing as tolerated as taught in physical therapy.  Use a                                walker or Crutches as instructed.  To prevent constipation: you may use a stool softener such as -               Colace ( over the counter) 100 mg by mouth twice a day                Drink plenty of fluids ( prune juice may be helpful) and high fiber foods                Miralax ( over the counter) for constipation as needed.    Precautions:  If you experience chest pain or shortness of breath - call 911 immediately               For transfer to the hospital emergency department!!               If you develop a fever greater that 101 F, purulent drainage from wound,                             increased redness or drainage from wound, or calf pain -- Call the office.  Follow- Up Appointment:  Please call for an appointment to be seen on 03/29/13                                              [facility] office:  [phone]            [place], [place] [address]                 Home Health physiical therapy to be provided by [facility] [phone]

## 2013-03-15 NOTE — Progress Notes (Signed)
RN's called to therapy gym after patient knee buckled while ambulating steps with assist and she hit her surgical knee on the stairwell. Vitals stable, no increase in pain occurred. Dressing reinforced as there were two small spots of bloody drainage. PA called in reference to incident. Will follow up after PA returns phone call.

## 2013-03-15 NOTE — Evaluation (Signed)
Occupational Therapy Evaluation and Discharge Patient Details Name: Veronica Phelps MRN: [id number] DOB: [birth year] Today's Date: 03/15/2013 Time: 4098-1191 OT Time Calculation (min): 9 min  OT Assessment / Plan / Recommendation History of present illness Pt is a 68 y/o female admitted s/p L TKA.   Clinical Impression   This 68 yo female admitted with above presents to acute OT with all education completed, will D/C from acute OT.    OT Assessment  Patient does not need any further OT services    Follow Up Recommendations  No OT follow up       Equipment Recommendations  None recommended by OT          Precautions / Restrictions Precautions Precautions: Fall;Knee Precaution Comments: Discussed towel roll under heel and NO pillow under knee.  Restrictions Weight Bearing Restrictions: Yes LLE Weight Bearing: Weight bearing as tolerated   Pertinent Vitals/Pain Pt had just hit her knee on the steps prior to my arrival in gym, so pt with increased pain and nursing in to check on pt.    ADL  Transfers/Ambulation Related to ADLs: I demonstrated to pt how she would step into/out of her shower stall to a 3n1 and she verbalized understading ADL Comments: Pt reports that husband can A her with LBADLs until she can do them for herself.     Acute Rehab OT Goals Patient Stated Goal: Home today hopefully  Visit Information  Last OT Received On: 03/15/13 Assistance Needed: +1 History of Present Illness: Pt is a 68 y/o female admitted s/p L TKA.       Prior [place] expects to be discharged to:: Private residence Living Arrangements: Spouse/significant other Available Help at Discharge: Family;Available 24 hours/day Type of Home: House Home Access: Stairs to enter [employer] of Steps: 3 Entrance Stairs-Rails: Left;Right Home Layout: One level Home Equipment: Environmental consultant - 2 wheels;Bedside commode Prior Function Level of  Independence: Independent Comments: Still driving Communication Communication: No difficulties Dominant Hand: Left         Vision/Perception Vision - History Patient Visual Report: No change from baseline   Cognition  Cognition Arousal/Alertness: Awake/alert Behavior During Therapy: Flat affect Overall Cognitive Status: Within Functional Limits for tasks assessed    Extremity/Trunk Assessment Upper Extremity Assessment Upper Extremity Assessment: Overall WFL for tasks assessed              End of Session OT - End of Session Patient left: in chair (in gym with nursing checking on her knee due to hitting it on the steps while with PT)       Almon Register [phone] 03/15/2013, 3:19 PM

## 2013-03-15 NOTE — Care Management Note (Signed)
CARE MANAGEMENT NOTE 03/15/2013  Patient:  Veronica Phelps, Veronica Phelps   Account Number:  [id number]  Date Initiated:  03/14/2013  Documentation initiated by:  Ricki Miller  Subjective/Objective Assessment:   68 yr old female s/p left total knee arthroplasty.     Action/Plan:   PT/OT eval  Case Manager spoke with patient concerning home health and DME needs. Preoperatively setup with [facility], no changes. RW,3in1 and CPM have been delivered.Patient has family support at discharge.   Anticipated DC Date:  03/15/2013   Anticipated DC Plan:  [place]  CM consult      [facility] Choice  HOME HEALTH  DURABLE MEDICAL EQUIPMENT   Choice offered to / List presented to:  C-1 Patient      DME agency  [employer]     [place] arranged  HH-2 PT      [facility] agency  [facility]   Status of service:  Completed, signed off Medicare Important Message given?   (If response is "NO", the following Medicare IM given date fields will be blank) Date Medicare IM given:   Date Additional Medicare IM given:    Discharge Disposition:  [place]  Per UR Regulation:    If discussed at Long Length of Stay Meetings, dates discussed:    Comments:

## 2013-03-15 NOTE — Progress Notes (Signed)
Written and verbal d/c instructions provided. Prescriptions given to patient, along with OTC instruction for colace and or miralax. Patient states she has poor understanding of am lovenox instructions and ask [place] to repeat instructions to her and her husband ipon his arrival. Patient denies any other questions and states she understands how and when to f/u with medical staff

## 2013-03-15 NOTE — Plan of Care (Signed)
Problem: Phase II Progression Outcomes Goal: Other Phase II Outcomes/Goals Outcome: Progressing Hemmovac removed this am by MD, PT progressing with ambulation

## 2013-03-15 NOTE — Progress Notes (Signed)
SPORTS MEDICINE AND JOINT REPLACEMENT  Lara Mulch, MD   Carlynn Spry, PA-C [address], [place], [place]  [address]                             [phone]   PROGRESS NOTE  Subjective:  negative for Chest Pain  negative for Shortness of Breath  negative for Nausea/Vomiting   negative for Calf Pain  negative for Bowel Movement   Tolerating Diet: yes         Patient reports pain as 5 on 0-10 scale.    Objective: Vital signs in last 24 hours:   Patient Vitals for the past 24 hrs:  BP Temp Temp src Pulse Resp SpO2 Height Weight  03/15/13 0425 106/54 mmHg 98.2 F (36.8 C) Oral 77 20 98 % - -  03/15/13 0400 - - - - 17 98 % - -  03/15/13 0009 120/51 mmHg 99 F (37.2 C) Oral 77 20 97 % - -  03/15/13 0000 - - - - 17 99 % - -  03/14/13 2039 132/62 mmHg 97.9 F (36.6 C) Oral 75 20 100 % - -  03/14/13 2000 - - - - 17 - - -  03/14/13 1653 125/54 mmHg 97.5 F (36.4 C) Oral 65 15 100 % - -  03/14/13 1556 - - - - 14 100 % - -  03/14/13 1510 - - - - - - 5\' 10"  (1.778 m) 90.266 kg (199 lb)  03/14/13 1323 123/54 mmHg 97.7 F (36.5 C) - 68 12 100 % - -  03/14/13 1300 - 97.4 F (36.3 C) - 73 13 99 % - -  03/14/13 1245 129/57 mmHg - - 72 9 98 % - -  03/14/13 1230 137/53 mmHg - - 73 9 98 % - -  03/14/13 1215 - - - 79 6 97 % - -  03/14/13 1200 123/70 mmHg - - 80 8 98 % - -  03/14/13 1153 144/60 mmHg 97.4 F (36.3 C) - 86 10 96 % - -    @flow {1959:LAST@   Intake/Output from previous day:   02/16 0701 - 02/17 0700 In: 1800 [P.O.:600; I.V.:1200] Out: 875 [Urine:700; Drains:75]   Intake/Output this shift:   02/17 0701 - 02/17 1900 In: -  Out: 125 [Drains:125]   Intake/Output     02/16 0701 - 02/17 0700 02/17 0701 - 02/18 0700   P.O. 600    I.V. (mL/kg) 1200 (13.3)    Total Intake(mL/kg) 1800 (19.9)    Urine (mL/kg/hr) 700    Emesis/NG output 50    Drains 75 125 (0.4)   Blood 50    Total Output 875 125   Net +925 -125        Urine Occurrence 2 x        LABORATORY DATA:  Recent Labs  03/14/13 1455 03/15/13 0410  WBC 12.0* 8.8  HGB 13.5 11.8*  HCT 39.4 35.2*  PLT 290 274    Recent Labs  03/14/13 1455 03/15/13 0410  NA  --  139  K  --  3.6*  CL  --  102  CO2  --  24  BUN  --  16  CREATININE 0.69 0.69  GLUCOSE  --  126*  CALCIUM  --  8.2*   Lab Results  Component Value Date   INR 0.88 03/04/2013    Examination:  General appearance: alert, cooperative and no distress Extremities: extremities  normal, atraumatic, no cyanosis or edema and Homans sign is negative, no sign of DVT  Wound Exam: clean, dry, intact   Drainage:  None: wound tissue dry  Motor Exam: EHL and FHL Intact  Sensory Exam: Deep Peroneal normal   Assessment:    1 Day Post-Op  Procedure(s) (LRB): LEFT TOTAL KNEE ARTHROPLASTY (Left)  ADDITIONAL DIAGNOSIS:  Active Problems:   S/P total knee arthroplasty  Acute Blood Loss Anemia   Plan: Physical Therapy as ordered Weight Bearing as Tolerated (WBAT)  DVT Prophylaxis:  Lovenox  DISCHARGE PLAN: Home  DISCHARGE NEEDS: HHPT, CPM, Walker and 3-in-1 comode seat         Kawehi Hostetter 03/15/2013, 10:18 AM

## 2013-03-15 NOTE — Plan of Care (Signed)
Problem: Consults Goal: Diagnosis- Total Joint Replacement Primary Total Knee Left     

## 2013-03-15 NOTE — Op Note (Signed)
TOTAL KNEE REPLACEMENT OPERATIVE NOTE:  03/14/2013  9:22 AM  PATIENT:  Veronica Phelps  68 y.o. female  PRE-OPERATIVE DIAGNOSIS:  osteoarthritis left knee  POST-OPERATIVE DIAGNOSIS:  osteoarthritis left knee  PROCEDURE:  Procedure(s): LEFT TOTAL KNEE ARTHROPLASTY  SURGEON:  Surgeon(s): Vickey Huger, MD  PHYSICIAN ASSISTANT: Carlynn Spry, [facility]  ANESTHESIA:   general  DRAINS: Hemovac  SPECIMEN: None  COUNTS:  Correct  TOURNIQUET:   Total Tourniquet Time Documented: Thigh (Left) - 47 minutes Total: Thigh (Left) - 47 minutes   DICTATION:  Indication for procedure:    The patient is a 67 y.o. female who has failed conservative treatment for osteoarthritis left knee.  Informed consent was obtained prior to anesthesia. The risks versus benefits of the operation were explain and in a way the patient can, and did, understand.   On the implant demand matching protocol, this patient scored 10.  Therefore, this patient was not receive a polyethylene insert with vitamin E which is a high demand implant.  Description of procedure:     The patient was taken to the operating room and placed under anesthesia.  The patient was positioned in the usual fashion taking care that all body parts were adequately padded and/or protected.  I foley catheter was not placed.  A tourniquet was applied and the leg prepped and draped in the usual sterile fashion.  The extremity was exsanguinated with the esmarch and tourniquet inflated to 350 mmHg.  Pre-operative range of motion was normal.  The knee was in 5 degree of mild valgus.  A midline incision approximately 6-7 inches long was made with a #10 blade.  A new blade was used to make a parapatellar arthrotomy going 2-3 cm into the quadriceps tendon, over the patella, and alongside the medial aspect of the patellar tendon.  A synovectomy was then performed with the #10 blade and forceps. I then elevated the deep MCL off the medial tibial metaphysis  subperiosteally around to the semimembranosus attachment.    I everted the patella and used calipers to measure patellar thickness.  I used the reamer to ream down to appropriate thickness to recreate the native thickness.  I then removed excess bone with the rongeur and sagittal saw.  I used the appropriately sized template and drilled the three lug holes.  I then put the trial in place and measured the thickness with the calipers to ensure recreation of the native thickness.  The trial was then removed and the patella subluxed and the knee brought into flexion.  A homan retractor was place to retract and protect the patella and lateral structures.  A Z-retractor was place medially to protect the medial structures.  The extra-medullary alignment system was used to make cut the tibial articular surface perpendicular to the anamotic axis of the tibia and in 3 degrees of posterior slope.  The cut surface and alignment jig was removed.  I then used the intramedullary alignment guide to make a 4 valgus cut on the distal femur.  I then marked out the epicondylar axis on the distal femur.  The posterior condylar axis measured 5 degrees.  I then used the anterior referencing sizer and measured the femur to be a size 7.  The 4-In-1 cutting block was screwed into place in external rotation matching the posterior condylar angle, making our cuts perpendicular to the epicondylar axis.  Anterior, posterior and chamfer cuts were made with the sagittal saw.  The cutting block and cut pieces were removed.  A  lamina spreader was placed in 90 degrees of flexion.  The ACL, PCL, menisci, and posterior condylar osteophytes were removed.  A 11 mm spacer blocked was found to offer good flexion and extension gap balance after minimal in degree releasing.   The scoop retractor was then placed and the femoral finishing block was pinned in place.  The small sagittal saw was used as well as the lug drill to finish the femur.  The block  and cut surfaces were removed and the medullary canal hole filled with autograft bone from the cut pieces.  The tibia was delivered forward in deep flexion and external rotation.  A size D tray was selected and pinned into place centered on the medial 1/3 of the tibial tubercle.  The reamer and keel was used to prepare the tibia through the tray.    I then trialed with the size 7 femur, size D tibia, a 11 mm insert and the 32 patella.  I had excellent flexion/extension gap balance, excellent patella tracking.  Flexion was full and beyond 120 degrees; extension was zero.  These components were chosen and the staff opened them to me on the back table while the knee was lavaged copiously and the cement mixed.  The soft tissue was infiltrated with 60cc of exparel 1.3% through a 21 gauge needle.  I cemented in the components and removed all excess cement.  The polyethylene tibial component was snapped into place and the knee placed in extension while cement was hardening.  The capsule was infilltrated with 30cc of .25% Marcaine with epinephrine.  A hemovac was place in the joint exiting superolaterally.  A pain pump was place superomedially superficial to the arthrotomy.  Once the cement was hard, the tourniquet was let down.  Hemostasis was obtained.  The arthrotomy was closed with figure-8 #1 vicryl sutures.  The deep soft tissues were closed with #0 vicryls and the subcuticular layer closed with a running #2-0 vicryl.  The skin was reapproximated and closed with skin staples.  The wound was dressed with xeroform, 4 x4's, 2 ABD sponges, a single layer of webril and a TED stocking.   The patient was then awakened, extubated, and taken to the recovery room in stable condition.  BLOOD LOSS:  300cc DRAINS: 1 hemovac, 1 pain catheter COMPLICATIONS:  None.  PLAN OF CARE: Admit to inpatient   PATIENT DISPOSITION:  PACU - hemodynamically stable.   Delay start of Pharmacological VTE agent (>24hrs) due to  surgical blood loss or risk of bleeding:  not applicable  Please fax a copy of this op note to my office at [phone] (please only include page 1 and 2 of the Case Information op note)

## 2013-03-15 NOTE — Progress Notes (Signed)
Physical Therapy Treatment Patient Details Name: Veronica Phelps MRN: [id number] DOB: 09/07/1945 Today's Date: 03/15/2013 Time: 4010-2725 PT Time Calculation (min): 52 min  PT Assessment / Plan / Recommendation  History of Present Illness Pt is a 68 y/o female admitted s/p L TKA.   PT Comments   Pt progressing towards physical therapy goals, with setback of near-fall during stair training. Knee immobilizer to be delivered to room, and plan for continued stair training this afternoon with clearance from PA as pt still plans to d/c home today.   Follow Up Recommendations  Home health PT     Does the patient have the potential to tolerate intense rehabilitation     Barriers to Discharge        Equipment Recommendations  None recommended by PT    Recommendations for Other Services    Frequency 7X/week   Progress towards PT Goals Progress towards PT goals: Progressing toward goals  Plan Current plan remains appropriate    Precautions / Restrictions Precautions Precautions: Fall;Knee Precaution Comments: Discussed towel roll under heel and NO pillow under knee.  Restrictions Weight Bearing Restrictions: Yes LLE Weight Bearing: Weight bearing as tolerated   Pertinent Vitals/Pain At end of session, pt reports minimal pain, not rated on 0-10 scale.     Mobility  Bed Mobility General bed mobility comments: NT - pt sitting up in chair upon PT arrival Transfers Overall transfer level: Needs assistance Equipment used: Rolling walker (2 wheeled) Transfers: Sit to/from Stand Sit to Stand: Min guard General transfer comment: VC's for hand placement on seated surface for safety. Increased time required to come to full stand. Ambulation/Gait Ambulation/Gait assistance: Min guard Ambulation Distance (Feet): 50 Feet Assistive device: Rolling walker (2 wheeled) Gait Pattern/deviations: Step-to pattern;Decreased stride length Gait velocity: Decreased Gait velocity interpretation: Below  normal speed for age/gender General Gait Details: VC's for increased quad activation, increased heel strike, and fluidity of walker movement. Stairs: Yes Stairs assistance: Total assist Stair Management: One rail Left;Sideways (BUE's on railing for support) Number of Stairs: 0 General stair comments: While attempting stairs, pt's operative knee buckled and was unable to recover. Therapist holding pt by gait belt and assisted pt to sit on therapist's knee until help came to assist pt to sitting in the recliner. During initial knee buckle, pt's operative knee hit railing post. She reports some increased pain initially, which decreased after ~5 minutes of sitting with LE's elevated. RN took vitals and applied reinforcement dressing on knee as there was 2 small spots of bloody drainage present. MD notified and pt returned to room.     Exercises Total Joint Exercises Ankle Circles/Pumps: 10 reps Quad Sets: 10 reps Towel Squeeze: 10 reps Heel Slides: 5 reps Hip ABduction/ADduction: 10 reps   PT Diagnosis:    PT Problem List:   PT Treatment Interventions:     PT Goals (current goals can now be found in the care plan section) Acute Rehab PT Goals Patient Stated Goal: Return home this afternoon with husband PT Goal Formulation: With patient/family Time For Goal Achievement: 03/21/13 Potential to Achieve Goals: Good  Visit Information  Last PT Received On: 03/15/13 Assistance Needed: +2 History of Present Illness: Pt is a 68 y/o female admitted s/p L TKA.    Subjective Data  Subjective: "I think I'm doing good, I want to try the steps." Patient Stated Goal: Return home this afternoon with husband   Cognition  Cognition Arousal/Alertness: Awake/alert Behavior During Therapy: Flat affect Overall Cognitive Status: Within Functional  Limits for tasks assessed    Balance  Balance Overall balance assessment: Needs assistance Sitting-balance support: Feet supported;Bilateral upper extremity  supported Sitting balance-Leahy Scale: Fair Standing balance support: Bilateral upper extremity supported Standing balance-Leahy Scale: Poor  End of Session PT - End of Session Equipment Utilized During Treatment: Gait belt Activity Tolerance: Other (comment) (Limited by knee buckling during stair training) Patient left: in chair;with call bell/phone within reach Nurse Communication: Mobility status   GP     Jolyn Lent 03/15/2013, 4:26 PM  Jolyn Lent, [place], DPT [phone]

## 2013-03-15 NOTE — Progress Notes (Signed)
Physical Therapy Treatment Patient Details Name: Veronica Phelps MRN: [id number] DOB: 1945/02/09 Today's Date: 03/15/2013 Time: 1856-3149 PT Time Calculation (min): 13 min  PT Assessment / Plan / Recommendation  History of Present Illness Pt is a 68 y/o female admitted s/p L TKA.   PT Comments   Per RN, PA cleared pt for continued plan to d/c home this afternoon. Knee immobilizer delivered to room. Pt education on donning immobilizer and when to use. Pt able to negotiate a total of 4 steps sideways with no LOB or knee buckling noted. Discussed car transfer, and safety awareness for transfer home. Pt states she has no further questions or concerns.  Follow Up Recommendations  Home health PT     Does the patient have the potential to tolerate intense rehabilitation     Barriers to Discharge        Equipment Recommendations  None recommended by PT    Recommendations for Other Services    Frequency 7X/week   Progress towards PT Goals Progress towards PT goals: Progressing toward goals  Plan Current plan remains appropriate    Precautions / Restrictions Precautions Precautions: Fall;Knee Precaution Comments: Discussed towel roll under heel and NO pillow under knee.  Required Braces or Orthoses: Knee Immobilizer - Left Knee Immobilizer - Left: On when out of bed or walking Restrictions Weight Bearing Restrictions: Yes LLE Weight Bearing: Weight bearing as tolerated   Pertinent Vitals/Pain Pt has no complaints of pain throughout session.    Mobility  Bed Mobility General bed mobility comments: NT - pt sitting up in chair upon PT arrival Transfers Overall transfer level: Needs assistance Equipment used: Rolling walker (2 wheeled) Transfers: Sit to/from Stand Sit to Stand: Min guard General transfer comment: No physical assist required. Guarding for safety.  Ambulation/Gait Ambulation/Gait assistance: Min guard Ambulation Distance (Feet): 10 Feet Assistive device: Rolling  walker (2 wheeled) Gait Pattern/deviations: Step-to pattern;Decreased stride length Gait velocity: Decreased Gait velocity interpretation: Below normal speed for age/gender General Gait Details: VC's for increased quad activation with WB. Stairs: Yes Stairs assistance: Min assist Stair Management: One rail Left;Sideways (BUE's on railing for support. ) Number of Stairs: 2 (x2) General stair comments: With knee immobilizer donned, pt able to negotiate 2 steps ascending and descending sideways with BUE's on railing. Assist for support with each step, and VC's for sequencing and safety awareness.     Exercises Total Joint Exercises Ankle Circles/Pumps: 10 reps Quad Sets: 10 reps Towel Squeeze: 10 reps Heel Slides: 5 reps Hip ABduction/ADduction: 10 reps   PT Diagnosis:    PT Problem List:   PT Treatment Interventions:     PT Goals (current goals can now be found in the care plan section) Acute Rehab PT Goals Patient Stated Goal: Return home this afternoon with husband PT Goal Formulation: With patient/family Time For Goal Achievement: 03/21/13 Potential to Achieve Goals: Good  Visit Information  Last PT Received On: 03/15/13 Assistance Needed: +2 History of Present Illness: Pt is a 68 y/o female admitted s/p L TKA.    Subjective Data  Subjective: Pt anxious to try steps with knee immobilizer Patient Stated Goal: Return home this afternoon with husband   Cognition  Cognition Arousal/Alertness: Awake/alert Behavior During Therapy: Flat affect Overall Cognitive Status: Within Functional Limits for tasks assessed    Balance  Balance Overall balance assessment: Needs assistance Sitting-balance support: Feet supported;Bilateral upper extremity supported Sitting balance-Leahy Scale: Fair Standing balance support: Bilateral upper extremity supported Standing balance-Leahy Scale: Poor  End of  Session PT - End of Session Equipment Utilized During Treatment: Gait belt;Left knee  immobilizer Activity Tolerance: Patient tolerated treatment well Patient left: in chair;with call bell/phone within reach Nurse Communication: Mobility status   GP     Jolyn Lent 03/15/2013, 4:44 PM  Jolyn Lent, [place], DPT [phone]

## 2013-03-16 NOTE — Discharge Summary (Signed)
SPORTS MEDICINE & JOINT REPLACEMENT   Veronica Mulch, MD   Veronica Spry, PA-C [place], [place], [place]  [address]                             [phone]  PATIENT ID: Veronica Phelps        MRN:  [id number]          DOB/AGE: January 13, 1946 / 68 y.o.    DISCHARGE SUMMARY  ADMISSION DATE:    03/14/2013 DISCHARGE DATE:   03/15/2013  ADMISSION DIAGNOSIS:  OA LEFT KNEE  osteoarthritis left knee    DISCHARGE DIAGNOSIS:  osteoarthritis left knee    ADDITIONAL DIAGNOSIS: Active Problems:   S/P total knee arthroplasty  Past Medical History  Diagnosis Date  . Complication of anesthesia     takes awhile to wake up   . Arthritis     oa    PROCEDURE: Procedure(s): LEFT TOTAL KNEE ARTHROPLASTY on 03/14/2013  CONSULTS:     HISTORY:  See H&P in chart  HOSPITAL COURSE:  Veronica Phelps is a 68 y.o. admitted on 03/14/2013 and found to have a diagnosis of osteoarthritis left knee.  After appropriate laboratory studies were obtained  they were taken to the operating room on 03/14/2013 and underwent Procedure(s): LEFT TOTAL KNEE ARTHROPLASTY.   They were given perioperative antibiotics:  Anti-infectives   Start     Dose/Rate Route Frequency Ordered Stop   03/14/13 1500  ceFAZolin (ANCEF) IVPB 2 g/50 mL premix     2 g 100 mL/hr over 30 Minutes Intravenous Every 6 hours 03/14/13 1319 03/14/13 2116   03/14/13 0600  ceFAZolin (ANCEF) IVPB 2 g/50 mL premix     2 g 100 mL/hr over 30 Minutes Intravenous On call to O.R. 03/13/13 1448 03/14/13 1000    .  Tolerated the procedure well.  Placed with a foley intraoperatively.  Given Ofirmev at induction and for 48 hours.    POD# 1: Vital signs were stable.  Patient denied Chest pain, shortness of breath, or calf pain.  Patient was started on Lovenox 30 mg subcutaneously twice daily at 8am.  Consults to PT, OT, and care management were made.  The patient was weight bearing as tolerated.  CPM was placed on the operative leg 0-90 degrees for 6-8  hours a day.  Incentive spirometry was taught.  Dressing was changed.  Marcaine pump and hemovac were discontinued.      POD #2, Continued  PT for ambulation and exercise program.  IV saline locked.  O2 discontinued.    The remainder of the hospital course was dedicated to ambulation and strengthening.   The patient was discharged on 1 day pst op in  Good condition.  Blood products given:none  DIAGNOSTIC STUDIES: Recent vital signs: Patient Vitals for the past 24 hrs:  BP Temp Temp src Pulse Resp SpO2  03/15/13 1456 121/61 mmHg 97.5 F (36.4 C) Oral 72 20 99 %       Recent laboratory studies:  Recent Labs  03/14/13 1455 03/15/13 0410  WBC 12.0* 8.8  HGB 13.5 11.8*  HCT 39.4 35.2*  PLT 290 274    Recent Labs  03/14/13 1455 03/15/13 0410  NA  --  139  K  --  3.6*  CL  --  102  CO2  --  24  BUN  --  16  CREATININE 0.69 0.69  GLUCOSE  --  126*  CALCIUM  --  8.2*   Lab Results  Component Value Date   INR 0.88 03/04/2013     Recent Radiographic Studies :  Dg Chest 2 View  03/04/2013   CLINICAL DATA:  Preop for left total knee arthroplasty  EXAM: CHEST  2 VIEW  COMPARISON:  None.  FINDINGS: The lungs are adequately inflated. There is no focal infiltrate. The cardiac silhouette is normal in size. The pulmonary vascularity is not engorged. There is no pleural effusion or pneumothorax. There is mild apical pleural thickening bilaterally. The observed portions of the bony structures are normal.  IMPRESSION: No active cardiopulmonary disease.   Electronically Signed   By: David  Martinique   On: 03/04/2013 09:32    DISCHARGE INSTRUCTIONS: Discharge Orders   Future Orders Complete By Expires   Call MD / Call 911  As directed    Comments:     If you experience chest pain or shortness of breath, CALL 911 and be transported to the hospital emergency room.  If you develope a fever above 101 F, pus (white drainage) or increased drainage or redness at the wound, or calf pain, call  your surgeon's office.   Change dressing  As directed    Comments:     Change dressing on wednesday, then change the dressing daily with sterile 4 x 4 inch gauze dressing and apply TED hose.   Constipation Prevention  As directed    Comments:     Drink plenty of fluids.  Prune juice may be helpful.  You may use a stool softener, such as Colace (over the counter) 100 mg twice a day.  Use MiraLax (over the counter) for constipation as needed.   CPM  As directed    Comments:     Continuous passive motion machine (CPM):      Use the CPM from 0 to 90 for 6-8 hours per day.      You may increase by 10 per day.  You may break it up into 2 or 3 sessions per day.      Use CPM for 2 weeks or until you are told to stop.   Diet - low sodium heart healthy  As directed    Do not put a pillow under the knee. Place it under the heel.  As directed    Driving restrictions  As directed    Comments:     No driving for 6 weeks   Increase activity slowly as tolerated  As directed    Lifting restrictions  As directed    Comments:     No lifting for 6 weeks   TED hose  As directed    Comments:     Use stockings (TED hose) for 3 weeks on both leg(s).  You may remove them at night for sleeping.      DISCHARGE MEDICATIONS:     Medication List         acetaminophen 500 MG tablet  Commonly known as:  TYLENOL  Take 1,000 mg by mouth daily as needed (for constant).     celecoxib 200 MG capsule  Commonly known as:  CELEBREX  Take 1 capsule (200 mg total) by mouth every 12 (twelve) hours.     enoxaparin 40 MG/0.4ML injection  Commonly known as:  LOVENOX  Inject 0.4 mLs (40 mg total) into the skin daily.     HYDROcodone-acetaminophen 10-325 MG per tablet  Commonly known as:  NORCO  Take 1-2 tablets by mouth every 4 (four) hours  as needed (breakthrough pain).     methocarbamol 500 MG tablet  Commonly known as:  ROBAXIN  Take 1-2 tablets (500-1,000 mg total) by mouth every 6 (six) hours as needed for  muscle spasms.        FOLLOW UP VISIT:       Follow-up Information   Follow up with Rudean Haskell, MD. Call on 03/29/2013.   Specialty:  Orthopedic Surgery   Contact information:   [address] [place] [address] [phone]       DISPOSITION: HOME   CONDITION:  Good   Ame Heagle 03/16/2013, 12:48 PM

## 2013-03-21 ENCOUNTER — Encounter (HOSPITAL_COMMUNITY): Payer: Self-pay | Admitting: Orthopedic Surgery

## 2013-03-29 ENCOUNTER — Other Ambulatory Visit: Payer: Self-pay | Admitting: Orthopedic Surgery

## 2013-04-04 SURGERY — ARTHROPLASTY, KNEE, TOTAL
Anesthesia: Choice | Site: Knee | Laterality: Right

## 2014-03-13 ENCOUNTER — Inpatient Hospital Stay (HOSPITAL_COMMUNITY): Payer: Medicare Other | Admitting: Anesthesiology

## 2014-03-13 ENCOUNTER — Encounter (HOSPITAL_COMMUNITY): Payer: Self-pay | Admitting: Emergency Medicine

## 2014-03-13 ENCOUNTER — Inpatient Hospital Stay (HOSPITAL_COMMUNITY)
Admission: EM | Admit: 2014-03-13 | Discharge: 2014-03-14 | DRG: 343 | Disposition: A | Discharge status: Home / Self Care | Payer: Medicare Other | Attending: General Surgery | Admitting: General Surgery

## 2014-03-13 ENCOUNTER — Encounter (HOSPITAL_COMMUNITY): Admission: EM | Disposition: A | Discharge status: Home / Self Care | Payer: Self-pay | Source: Home / Self Care

## 2014-03-13 ENCOUNTER — Emergency Department (HOSPITAL_COMMUNITY): Payer: Medicare Other

## 2014-03-13 DIAGNOSIS — D270 Benign neoplasm of right ovary: Secondary | ICD-10-CM

## 2014-03-13 DIAGNOSIS — Z9049 Acquired absence of other specified parts of digestive tract: Secondary | ICD-10-CM

## 2014-03-13 DIAGNOSIS — K37 Unspecified appendicitis: Secondary | ICD-10-CM | POA: Diagnosis present

## 2014-03-13 DIAGNOSIS — M179 Osteoarthritis of knee, unspecified: Secondary | ICD-10-CM | POA: Diagnosis present

## 2014-03-13 DIAGNOSIS — N858 Other specified noninflammatory disorders of uterus: Secondary | ICD-10-CM | POA: Diagnosis present

## 2014-03-13 DIAGNOSIS — K3580 Unspecified acute appendicitis: Secondary | ICD-10-CM | POA: Diagnosis present

## 2014-03-13 DIAGNOSIS — N200 Calculus of kidney: Secondary | ICD-10-CM

## 2014-03-13 DIAGNOSIS — R1031 Right lower quadrant pain: Secondary | ICD-10-CM

## 2014-03-13 DIAGNOSIS — Z96652 Presence of left artificial knee joint: Secondary | ICD-10-CM | POA: Diagnosis present

## 2014-03-13 HISTORY — PX: LAPAROSCOPIC APPENDECTOMY: SHX408

## 2014-03-13 LAB — COMPREHENSIVE METABOLIC PANEL
ALT: 50 U/L — AB (ref 0–35)
AST: 35 U/L (ref 0–37)
Albumin: 4.2 g/dL (ref 3.5–5.2)
Alkaline Phosphatase: 99 U/L (ref 39–117)
Anion gap: 9 (ref 5–15)
BUN: 14 mg/dL (ref 6–23)
CO2: 27 mmol/L (ref 19–32)
CREATININE: 0.66 mg/dL (ref 0.50–1.10)
Calcium: 9.4 mg/dL (ref 8.4–10.5)
Chloride: 105 mmol/L (ref 96–112)
GFR calc non Af Amer: 89 mL/min — ABNORMAL LOW (ref >90)
Glucose, Bld: 95 mg/dL (ref 70–99)
Potassium: 3.8 mmol/L (ref 3.5–5.1)
Sodium: 141 mmol/L (ref 135–145)
TOTAL PROTEIN: 7 g/dL (ref 6.0–8.3)
Total Bilirubin: 1.1 mg/dL (ref 0.3–1.2)

## 2014-03-13 LAB — CBC
HCT: 42.3 % (ref 36.0–46.0)
Hemoglobin: 14.2 g/dL (ref 12.0–15.0)
MCH: 31.8 pg (ref 26.0–34.0)
MCHC: 33.6 g/dL (ref 30.0–36.0)
MCV: 94.6 fL (ref 78.0–100.0)
PLATELETS: 259 10*3/uL (ref 150–400)
RBC: 4.47 MIL/uL (ref 3.87–5.11)
RDW: 13 % (ref 11.5–15.5)
WBC: 6.1 10*3/uL (ref 4.0–10.5)

## 2014-03-13 LAB — URINALYSIS, ROUTINE W REFLEX MICROSCOPIC
Bilirubin Urine: NEGATIVE
GLUCOSE, UA: NEGATIVE mg/dL
Hgb urine dipstick: NEGATIVE
Ketones, ur: NEGATIVE mg/dL
NITRITE: NEGATIVE
Protein, ur: NEGATIVE mg/dL
Specific Gravity, Urine: 1.008 (ref 1.005–1.030)
UROBILINOGEN UA: 0.2 mg/dL (ref 0.0–1.0)
pH: 7 (ref 5.0–8.0)

## 2014-03-13 LAB — SURGICAL PCR SCREEN
MRSA, PCR: NEGATIVE
STAPHYLOCOCCUS AUREUS: NEGATIVE

## 2014-03-13 LAB — URINE MICROSCOPIC-ADD ON

## 2014-03-13 SURGERY — APPENDECTOMY, LAPAROSCOPIC
Anesthesia: General | Site: Abdomen

## 2014-03-13 MED ORDER — ONDANSETRON HCL 4 MG/2ML IJ SOLN
INTRAMUSCULAR | Status: DC | PRN
  Administered 2014-03-13: 4 mg via INTRAVENOUS

## 2014-03-13 MED ORDER — SODIUM CHLORIDE 0.9 % IV BOLUS (SEPSIS)
500.0000 mL | Freq: Once | INTRAVENOUS | Status: AC
  Administered 2014-03-13: 500 mL via INTRAVENOUS

## 2014-03-13 MED ORDER — ROCURONIUM BROMIDE 100 MG/10ML IV SOLN
INTRAVENOUS | Status: AC
  Filled 2014-03-13: qty 1

## 2014-03-13 MED ORDER — POTASSIUM CHLORIDE IN NACL 20-0.9 MEQ/L-% IV SOLN
INTRAVENOUS | Status: DC
  Administered 2014-03-13: 22:00:00 via INTRAVENOUS
  Filled 2014-03-13 (×3): qty 1000

## 2014-03-13 MED ORDER — MORPHINE SULFATE 2 MG/ML IJ SOLN
2.0000 mg | Freq: Once | INTRAMUSCULAR | Status: DC
  Filled 2014-03-13: qty 1

## 2014-03-13 MED ORDER — FENTANYL CITRATE 0.05 MG/ML IJ SOLN
INTRAMUSCULAR | Status: DC | PRN
  Administered 2014-03-13: 100 ug via INTRAVENOUS

## 2014-03-13 MED ORDER — IOHEXOL 300 MG/ML  SOLN
100.0000 mL | Freq: Once | INTRAMUSCULAR | Status: AC | PRN
  Administered 2014-03-13: 100 mL via INTRAVENOUS

## 2014-03-13 MED ORDER — ONDANSETRON HCL 4 MG/2ML IJ SOLN
4.0000 mg | Freq: Once | INTRAMUSCULAR | Status: AC
  Administered 2014-03-13: 4 mg via INTRAVENOUS
  Filled 2014-03-13: qty 2

## 2014-03-13 MED ORDER — GLYCOPYRROLATE 0.2 MG/ML IJ SOLN
INTRAMUSCULAR | Status: DC | PRN
  Administered 2014-03-13: 0.6 mg via INTRAVENOUS

## 2014-03-13 MED ORDER — LIDOCAINE HCL (PF) 2 % IJ SOLN
INTRAMUSCULAR | Status: DC | PRN
  Administered 2014-03-13: 20 mg via INTRADERMAL

## 2014-03-13 MED ORDER — ROCURONIUM BROMIDE 100 MG/10ML IV SOLN
INTRAVENOUS | Status: DC | PRN
  Administered 2014-03-13: 30 mg via INTRAVENOUS

## 2014-03-13 MED ORDER — LACTATED RINGERS IV SOLN: INTRAVENOUS | Status: DC

## 2014-03-13 MED ORDER — DEXAMETHASONE SODIUM PHOSPHATE 10 MG/ML IJ SOLN
INTRAMUSCULAR | Status: AC
  Filled 2014-03-13: qty 1

## 2014-03-13 MED ORDER — MIDAZOLAM HCL 2 MG/2ML IJ SOLN
INTRAMUSCULAR | Status: AC
  Filled 2014-03-13: qty 2

## 2014-03-13 MED ORDER — LACTATED RINGERS IV SOLN
INTRAVENOUS | Status: DC | PRN
  Administered 2014-03-13: 1000 mL

## 2014-03-13 MED ORDER — DIPHENHYDRAMINE HCL 50 MG/ML IJ SOLN: 12.5000 mg | Freq: Four times a day (QID) | INTRAMUSCULAR | Status: DC | PRN

## 2014-03-13 MED ORDER — 0.9 % SODIUM CHLORIDE (POUR BTL) OPTIME
TOPICAL | Status: DC | PRN
  Administered 2014-03-13: 1000 mL

## 2014-03-13 MED ORDER — HYDROCODONE-ACETAMINOPHEN 5-325 MG PO TABS: 1.0000 | ORAL_TABLET | ORAL | Status: DC | PRN

## 2014-03-13 MED ORDER — PIPERACILLIN-TAZOBACTAM 3.375 G IVPB 30 MIN
3.3750 g | Freq: Once | INTRAVENOUS | Status: AC
  Administered 2014-03-13: 3.375 g via INTRAVENOUS
  Filled 2014-03-13: qty 50

## 2014-03-13 MED ORDER — FENTANYL CITRATE 0.05 MG/ML IJ SOLN
INTRAMUSCULAR | Status: AC
  Filled 2014-03-13: qty 2

## 2014-03-13 MED ORDER — FENTANYL CITRATE 0.05 MG/ML IJ SOLN
25.0000 ug | INTRAMUSCULAR | Status: DC | PRN
  Administered 2014-03-13: 25 ug via INTRAVENOUS

## 2014-03-13 MED ORDER — DIPHENHYDRAMINE HCL 12.5 MG/5ML PO ELIX: 12.5000 mg | ORAL_SOLUTION | Freq: Four times a day (QID) | ORAL | Status: DC | PRN

## 2014-03-13 MED ORDER — NEOSTIGMINE METHYLSULFATE 10 MG/10ML IV SOLN
INTRAVENOUS | Status: DC | PRN
  Administered 2014-03-13: 4 mg via INTRAVENOUS

## 2014-03-13 MED ORDER — IOHEXOL 300 MG/ML  SOLN
50.0000 mL | Freq: Once | INTRAMUSCULAR | Status: AC | PRN
  Administered 2014-03-13: 50 mL via ORAL

## 2014-03-13 MED ORDER — MORPHINE SULFATE 2 MG/ML IJ SOLN: 2.0000 mg | INTRAMUSCULAR | Status: DC | PRN

## 2014-03-13 MED ORDER — PROPOFOL 10 MG/ML IV BOLUS
INTRAVENOUS | Status: DC | PRN
  Administered 2014-03-13: 130 mg via INTRAVENOUS

## 2014-03-13 MED ORDER — LIDOCAINE HCL (CARDIAC) 20 MG/ML IV SOLN
INTRAVENOUS | Status: AC
  Filled 2014-03-13: qty 5

## 2014-03-13 MED ORDER — BUPIVACAINE HCL (PF) 0.5 % IJ SOLN
INTRAMUSCULAR | Status: AC
  Filled 2014-03-13: qty 30

## 2014-03-13 MED ORDER — PROMETHAZINE HCL 25 MG/ML IJ SOLN
6.2500 mg | INTRAMUSCULAR | Status: DC | PRN
  Administered 2014-03-13: 6.25 mg via INTRAVENOUS

## 2014-03-13 MED ORDER — MIDAZOLAM HCL 5 MG/5ML IJ SOLN
INTRAMUSCULAR | Status: DC | PRN
  Administered 2014-03-13: 2 mg via INTRAVENOUS

## 2014-03-13 MED ORDER — DEXTROSE 5 % IV SOLN
INTRAVENOUS | Status: AC
  Filled 2014-03-13: qty 2

## 2014-03-13 MED ORDER — SUCCINYLCHOLINE CHLORIDE 20 MG/ML IJ SOLN
INTRAMUSCULAR | Status: DC | PRN
  Administered 2014-03-13: 100 mg via INTRAVENOUS

## 2014-03-13 MED ORDER — PROMETHAZINE HCL 25 MG/ML IJ SOLN
INTRAMUSCULAR | Status: AC
Start: 2014-03-13 — End: 2014-03-14
  Filled 2014-03-13: qty 1

## 2014-03-13 MED ORDER — ONDANSETRON HCL 4 MG/2ML IJ SOLN
4.0000 mg | Freq: Once | INTRAMUSCULAR | Status: DC | PRN
Start: 2014-03-13 — End: 2014-03-13

## 2014-03-13 MED ORDER — PROPOFOL 10 MG/ML IV BOLUS
INTRAVENOUS | Status: AC
  Filled 2014-03-13: qty 20

## 2014-03-13 MED ORDER — DEXAMETHASONE SODIUM PHOSPHATE 10 MG/ML IJ SOLN
INTRAMUSCULAR | Status: DC | PRN
  Administered 2014-03-13: 10 mg via INTRAVENOUS

## 2014-03-13 MED ORDER — BUPIVACAINE HCL (PF) 0.5 % IJ SOLN
INTRAMUSCULAR | Status: DC | PRN
  Administered 2014-03-13: 10 mL

## 2014-03-13 MED ORDER — ONDANSETRON HCL 4 MG/2ML IJ SOLN
INTRAMUSCULAR | Status: AC
  Filled 2014-03-13: qty 2

## 2014-03-13 MED ORDER — LACTATED RINGERS IV SOLN
INTRAVENOUS | Status: DC | PRN
  Administered 2014-03-13: 18:00:00 via INTRAVENOUS

## 2014-03-13 MED ORDER — ONDANSETRON HCL 4 MG/2ML IJ SOLN: 4.0000 mg | INTRAMUSCULAR | Status: DC | PRN

## 2014-03-13 MED ORDER — POTASSIUM CHLORIDE IN NACL 20-0.9 MEQ/L-% IV SOLN
INTRAVENOUS | Status: AC
  Filled 2014-03-13: qty 1000

## 2014-03-13 MED ORDER — GLYCOPYRROLATE 0.2 MG/ML IJ SOLN
INTRAMUSCULAR | Status: AC
Start: 2014-03-13 — End: 2014-03-13
  Filled 2014-03-13: qty 3

## 2014-03-13 MED ORDER — DEXTROSE 5 % IV SOLN
2.0000 g | INTRAVENOUS | Status: AC
  Administered 2014-03-14: 2 g via INTRAVENOUS
  Filled 2014-03-13 (×2): qty 2

## 2014-03-13 SURGICAL SUPPLY — 48 items
APL SKNCLS STERI-STRIP NONHPOA (GAUZE/BANDAGES/DRESSINGS) ×1
APPLIER CLIP 5 13 M/L LIGAMAX5 (MISCELLANEOUS)
APPLIER CLIP ROT 10 11.4 M/L (STAPLE)
APR CLP MED LRG 11.4X10 (STAPLE)
APR CLP MED LRG 5 ANG JAW (MISCELLANEOUS)
BAG SPEC RTRVL LRG 6X4 10 (ENDOMECHANICALS) ×1
BENZOIN TINCTURE PRP APPL 2/3 (GAUZE/BANDAGES/DRESSINGS) ×3 IMPLANT
CABLE HIGH FREQUENCY MONO STRZ (ELECTRODE) ×2 IMPLANT
CHLORAPREP W/TINT 26ML (MISCELLANEOUS) ×3 IMPLANT
CLIP APPLIE 5 13 M/L LIGAMAX5 (MISCELLANEOUS) IMPLANT
CLIP APPLIE ROT 10 11.4 M/L (STAPLE) IMPLANT
CLOSURE WOUND 1/2 X4 (GAUZE/BANDAGES/DRESSINGS)
CUTTER FLEX LINEAR 45M (STAPLE) ×2 IMPLANT
DECANTER SPIKE VIAL GLASS SM (MISCELLANEOUS) IMPLANT
DRAIN CHANNEL 19F RND (DRAIN) IMPLANT
DRAPE LAPAROSCOPIC ABDOMINAL (DRAPES) ×1 IMPLANT
DRSG TEGADERM 2-3/8X2-3/4 SM (GAUZE/BANDAGES/DRESSINGS) IMPLANT
ELECT REM PT RETURN 9FT ADLT (ELECTROSURGICAL) ×3
ELECTRODE REM PT RTRN 9FT ADLT (ELECTROSURGICAL) ×1 IMPLANT
ENDOLOOP SUT PDS II  0 18 (SUTURE)
ENDOLOOP SUT PDS II 0 18 (SUTURE) IMPLANT
EVACUATOR SILICONE 100CC (DRAIN) IMPLANT
GAUZE SPONGE 2X2 8PLY STRL LF (GAUZE/BANDAGES/DRESSINGS) ×1 IMPLANT
GLOVE ECLIPSE 8.0 STRL XLNG CF (GLOVE) ×3 IMPLANT
GLOVE INDICATOR 8.0 STRL GRN (GLOVE) ×3 IMPLANT
GOWN STRL REUS W/TWL XL LVL3 (GOWN DISPOSABLE) ×6 IMPLANT
KIT BASIN OR (CUSTOM PROCEDURE TRAY) ×3 IMPLANT
POUCH SPECIMEN RETRIEVAL 10MM (ENDOMECHANICALS) ×3 IMPLANT
RELOAD 45 VASCULAR/THIN (ENDOMECHANICALS) IMPLANT
RELOAD STAPLE 45 2.5 WHT GRN (ENDOMECHANICALS) IMPLANT
RELOAD STAPLE 45 3.5 BLU ETS (ENDOMECHANICALS) IMPLANT
RELOAD STAPLE TA45 3.5 REG BLU (ENDOMECHANICALS) ×6 IMPLANT
SCISSORS LAP 5X35 DISP (ENDOMECHANICALS) IMPLANT
SET IRRIG TUBING LAPAROSCOPIC (IRRIGATION / IRRIGATOR) ×3 IMPLANT
SHEARS HARMONIC ACE PLUS 36CM (ENDOMECHANICALS) ×3 IMPLANT
SLEEVE XCEL OPT CAN 5 100 (ENDOMECHANICALS) ×3 IMPLANT
SOLUTION ANTI FOG 6CC (MISCELLANEOUS) IMPLANT
SPONGE GAUZE 2X2 STER 10/PKG (GAUZE/BANDAGES/DRESSINGS)
STRIP CLOSURE SKIN 1/2X4 (GAUZE/BANDAGES/DRESSINGS) ×1 IMPLANT
SUT ETHILON 3 0 PS 1 (SUTURE) IMPLANT
SUT MNCRL AB 4-0 PS2 18 (SUTURE) ×3 IMPLANT
TOWEL OR 17X26 10 PK STRL BLUE (TOWEL DISPOSABLE) ×3 IMPLANT
TOWEL OR NON WOVEN STRL DISP B (DISPOSABLE) ×3 IMPLANT
TRAY FOLEY CATH 14FRSI W/METER (CATHETERS) ×3 IMPLANT
TRAY LAPAROSCOPIC (CUSTOM PROCEDURE TRAY) ×3 IMPLANT
TROCAR BLADELESS OPT 5 100 (ENDOMECHANICALS) ×3 IMPLANT
TROCAR XCEL BLUNT TIP 100MML (ENDOMECHANICALS) ×3 IMPLANT
TUBING INSUFFLATION 10FT LAP (TUBING) ×3 IMPLANT

## 2014-03-13 NOTE — ED Provider Notes (Signed)
CSN: [id number]     Arrival date & time 03/13/14  1330 History   First MD Initiated Contact with Patient 03/13/14 1341     Chief Complaint  Patient presents with  . Abdominal Pain     (Consider location/radiation/quality/duration/timing/severity/associated sxs/prior Treatment) Patient is a 69 y.o. female presenting with abdominal pain. The history is provided by the patient.  Abdominal Pain Associated symptoms: no chest pain, no chills, no cough, no diarrhea, no dysuria, no fever, no shortness of breath, no sore throat and no vomiting   pt c/o rlq abdominal pain for the past 2 days. Pain constant, dull, moderate. Worse w palpation. Does not radiate. No flank or back pain. No hx same pain. No prior abd surgery except remote hx of a laparoscopic gyn procedure.  No hx diverticula. No dysuria, hematuria or gu c/o. No fever or chills. Decreased appetite. No nv. Had small bm today.    Past Medical History  Diagnosis Date  . Complication of anesthesia     takes awhile to wake up   . Arthritis     oa   Past Surgical History  Procedure Laterality Date  . Hernia repair      1970s x2   . Laparoscopy    . Knee arthroscopy w/ meniscal repair      left    11/2011  . Total knee arthroplasty Left 03/14/2013    Procedure: LEFT TOTAL KNEE ARTHROPLASTY;  Surgeon: Vickey Huger, MD;  Location: [facility];  Service: Orthopedics;  Laterality: Left;   No family history on file. History  Substance Use Topics  . Smoking status: Never Smoker   . Smokeless tobacco: Not on file  . Alcohol Use: No   OB History    No data available     Review of Systems  Constitutional: Negative for fever and chills.  HENT: Negative for sore throat.   Eyes: Negative for redness.  Respiratory: Negative for cough and shortness of breath.   Cardiovascular: Negative for chest pain.  Gastrointestinal: Positive for abdominal pain. Negative for vomiting and diarrhea.  Endocrine: Negative for polyuria.  Genitourinary:  Negative for dysuria and flank pain.  Musculoskeletal: Negative for back pain and neck pain.  Skin: Negative for rash.  Neurological: Negative for headaches.  Hematological: Does not bruise/bleed easily.  Psychiatric/Behavioral: Negative for confusion.      Allergies  Oxycontin and Ultram  Home Medications   Prior to Admission medications   Medication Sig Start Date End Date Taking? Authorizing Provider  acetaminophen (TYLENOL) 500 MG tablet Take 1,000 mg by mouth daily as needed (for constant).    Historical Provider, MD  celecoxib (CELEBREX) 200 MG capsule Take 1 capsule (200 mg total) by mouth every 12 (twelve) hours. 03/15/13   Carlynn Spry, PA-C  enoxaparin (LOVENOX) 40 MG/0.4ML injection Inject 0.4 mLs (40 mg total) into the skin daily. 03/15/13   Carlynn Spry, PA-C  HYDROcodone-acetaminophen (NORCO) 10-325 MG per tablet Take 1-2 tablets by mouth every 4 (four) hours as needed (breakthrough pain). 03/15/13   Carlynn Spry, PA-C  methocarbamol (ROBAXIN) 500 MG tablet Take 1-2 tablets (500-1,000 mg total) by mouth every 6 (six) hours as needed for muscle spasms. 03/15/13   Carlynn Spry, PA-C   BP 143/73 mmHg  Pulse 71  Temp(Src) 97.9 F (36.6 C) (Oral)  Resp 18  SpO2 100% Physical Exam  Constitutional: She appears well-developed and well-nourished. No distress.  HENT:  Mouth/Throat: Oropharynx is clear and moist.  Eyes: Conjunctivae are normal. No scleral  icterus.  Neck: Neck supple. No tracheal deviation present.  Cardiovascular: Normal rate, regular rhythm, normal heart sounds and intact distal pulses.   Pulmonary/Chest: Effort normal and breath sounds normal. No respiratory distress.  Abdominal: Soft. Normal appearance and bowel sounds are normal. She exhibits no distension and no mass. There is tenderness. There is no rebound and no guarding.  Moderate right lower abdominal tenderness. No rebound or guarding.   Genitourinary:  No cva tenderness  Musculoskeletal: She  exhibits no edema.  Neurological: She is alert.  Skin: Skin is warm and dry. No rash noted. She is not diaphoretic.  Psychiatric: She has a normal mood and affect.  Nursing note and vitals reviewed.   ED Course  Procedures (including critical care time) Labs Review  Results for orders placed or performed during the hospital encounter of 03/13/14  CBC  Result Value Ref Range   WBC 6.1 4.0 - 10.5 K/uL   RBC 4.47 3.87 - 5.11 MIL/uL   Hemoglobin 14.2 12.0 - 15.0 g/dL   HCT 42.3 36.0 - 46.0 %   MCV 94.6 78.0 - 100.0 fL   MCH 31.8 26.0 - 34.0 pg   MCHC 33.6 30.0 - 36.0 g/dL   RDW 13.0 11.5 - 15.5 %   Platelets 259 150 - 400 K/uL  Comprehensive metabolic panel  Result Value Ref Range   Sodium 141 135 - 145 mmol/L   Potassium 3.8 3.5 - 5.1 mmol/L   Chloride 105 96 - 112 mmol/L   CO2 27 19 - 32 mmol/L   Glucose, Bld 95 70 - 99 mg/dL   BUN 14 6 - 23 mg/dL   Creatinine, Ser 0.66 0.50 - 1.10 mg/dL   Calcium 9.4 8.4 - 10.5 mg/dL   Total Protein 7.0 6.0 - 8.3 g/dL   Albumin 4.2 3.5 - 5.2 g/dL   AST 35 0 - 37 U/L   ALT 50 (H) 0 - 35 U/L   Alkaline Phosphatase 99 39 - 117 U/L   Total Bilirubin 1.1 0.3 - 1.2 mg/dL   GFR calc non Af Amer 89 (L) >90 mL/min   GFR calc Af Amer >90 >90 mL/min   Anion gap 9 5 - 15   Ct Abdomen Pelvis W Contrast  03/13/2014   CLINICAL DATA:  69 year old female with right lower quadrant pain for 2 days. Query appendicitis. Personal history of hernia repair. Initial encounter.  EXAM: CT ABDOMEN AND PELVIS WITH CONTRAST  TECHNIQUE: Multidetector CT imaging of the abdomen and pelvis was performed using the standard protocol following bolus administration of intravenous contrast.  CONTRAST:  75mL OMNIPAQUE IOHEXOL 300 MG/ML SOLN, 136mL OMNIPAQUE IOHEXOL 300 MG/ML SOLN  COMPARISON:  None.  FINDINGS: Atelectasis at both lung bases. Mild cardiomegaly. No pericardial or pleural effusion.  Degenerative changes in the lower lumbar spine. No acute osseous abnormality  identified.  No pelvic free fluid.  Negative rectum.  Uterus and left adnexal within normal limits. There is a 4.3 cm diameter macroscopic fat containing mass of the right adnexa (series 2, image 66). Unremarkable bladder.  Sigmoid diverticulosis, no active inflammation identified. Negative left colon. Mild motion artifact in the abdomen. Negative transverse colon. Negative right colon. However, the appendix is dilated and mildly inflamed. His peripherally enhancing measuring up to 10 mm in size and there is mild surrounding fat stranding.  The terminal ileum appears unaffected and normal. No dilated small bowel. Negative stomach, duodenum, liver, gallbladder, spleen, pancreas, adrenal glands, and portal venous system. Aortoiliac calcified atherosclerosis noted.  Major arterial structures are patent. Left greater than right renal parapelvic cysts. Nonobstructing 2-3 mm left renal mid and lower pole calculi. Symmetric renal contrast excretion. No abdominal free fluid.  IMPRESSION: 1. Positive for acute appendicitis. No free fluid or complicating features. 2. 4.3 cm right ovarian dermoid cyst (mature cystic ovarian teratoma). 3. Left nephrolithiasis.   Electronically Signed   By: Genevie Ann M.D.   On: 03/13/2014 15:36       MDM   Iv ns bolus. Morphine 2 mg iv, zofran iv.  Ct.  Reviewed nursing notes and prior charts for additional history.   Ct resulted. gen surgery paged.  Iv ns bolus. Npo.  Zosyn iv.  Also discussed ovarian cyst w pt - gyn follow up/referral at d/c from hospital.     Mirna Mires, MD 03/13/14 1540

## 2014-03-13 NOTE — Anesthesia Procedure Notes (Signed)
Procedure Name: Intubation Date/Time: 03/13/2014 6:23 PM Performed by: Lajuana Carry E Pre-anesthesia Checklist: Patient identified, Emergency Drugs available, Suction available and Patient being monitored Patient Re-evaluated:Patient Re-evaluated prior to inductionOxygen Delivery Method: Circle System Utilized Preoxygenation: Pre-oxygenation with 100% oxygen Intubation Type: IV induction Ventilation: Mask ventilation without difficulty Laryngoscope Size: Miller and 2 Grade View: Grade I Tube type: Oral Tube size: 7.0 mm Number of attempts: 2 (ETT failed to advance thru cords, bougie used w/o difficulty) Airway Equipment and Method: Bougie stylet Placement Confirmation: ETT inserted through vocal cords under direct vision,  positive ETCO2 and breath sounds checked- equal and bilateral Secured at: 21 cm Tube secured with: Tape Dental Injury: Teeth and Oropharynx as per pre-operative assessment

## 2014-03-13 NOTE — ED Notes (Signed)
Per pt, states right lower quadrant pain since Sat-saw PCP and was sent here for possible appey

## 2014-03-13 NOTE — Progress Notes (Signed)
Patient seen and examined.  CT reviewed.  Discussed with Dr. Lucia Gaskins.  Plan laparoscopic possible open appendectomy.  I have discussed the procedure and risks of appendectomy. The risks include but are not limited to bleeding, infection, wound problems, anesthesia, injury to intra-abdominal organs.

## 2014-03-13 NOTE — Op Note (Signed)
Appendectomy, Lap, Procedure Note  Pre-operative Diagnosis:  Acute appendicitis  Post-operative Diagnosis: Same  Procedure:  Laparoscopic appendectomy  Surgeon:  Jackolyn Confer, M.D.  Anesthesia:  General  Indications:  This is a 69 year old female who 48 hours ago had diffuse abdominal pain. The pain radiated to the right lower quadrant. She presented to the emergency department where she was evaluated. CT scan was consistent with acute appendicitis. A 4 cm right ovarian cystic mass was also found. She is now brought to the operating room for laparoscopic possible open appendectomy.  Procedure Details   She was brought to the operating room, placed in the supine position and general anesthesia was induced, along with placement of orogastric tube, SCDs, and a Foley catheter. A timeout was performed. The abdomen was prepped and draped in a sterile fashion. A small infraumbilical incision was made through the skin, subcutaneous tissue, fascia, and peritoneum entering the peritoneal cavity under direct vision.  A pursestring suture was passed around the fascia with a 0 Vicryl.  The Hasson was introduced into the peritoneal cavity and the tails of the suture were used to hold the Hasson in place.   The pneumoperitoneum was then established to steady pressure of 15 mmHg.   The laparoscope was introduced and there was no evidence of bleeding or underlying organ injury. Additional 5 mm cannulas then placed in the left lower quadrant of the abdomen and the right upper quadrant region under direct visualization. A careful evaluation of the entire abdomen was carried out. The patient was placed in Trendelenburg and left lateral decubitus position. The small intestines were retracted in the cephalad and left lateral direction away from the pelvis and right lower quadrant. The patient was found to have an enlarged and inflamed appendix that was extending inferiorly. There was no evidence of  perforation.  The appendix was carefully mobilized. The mesoappendix was was divided with the harmonic scalpel.   The appendix was amputated off the cecum, with a small cuff of cecum, using an endo-GIA stapler.  The appendix was placed in a retrieval bag and removed through the subumbilical port incision.    There was no evidence of bleeding, leakage, or complication after division of the appendix. Copious irrigation was  performed and irrigant fluid suctioned from the abdomen as much as possible.  The right ovarian mass was identified. There is also a small tumor on the anterior aspect of the uterus. Pictures were taken of these.  The umbilical trocar was removed and the  port site fascia was closed via the purse string suture under laparoscopic vision. There was no residual palpable fascial defect.  The remaining trocars were removed and all  trocar site skin wounds were closed with 4-0 Monocryl.  Dermabond was applied to the incisions.  Instrument, sponge, and needle counts were correct at the conclusion of the case.   Findings: The appendix was found to be inflamed. There were not signs of necrosis.  There was not perforation. There was not abscess formation.  Estimated Blood Loss:  less than 100 mL         Drains: none         Specimens: appendix         Complications:  None; patient tolerated the procedure well.         Disposition: PACU - hemodynamically stable.         Condition: stable

## 2014-03-13 NOTE — Anesthesia Preprocedure Evaluation (Addendum)
Anesthesia Evaluation  Patient identified by MRN, date of birth, ID band Patient awake    Reviewed: Allergy & Precautions, NPO status , Patient's Chart, lab work & pertinent test results  History of Anesthesia Complications (+) history of anesthetic complications ("hx of being slow to emerge")  Airway Mallampati: II  TM Distance: >3 FB Neck ROM: Full    Dental no notable dental hx. (+) Dental Advisory Given   Pulmonary neg pulmonary ROS,  breath sounds clear to auscultation  Pulmonary exam normal       Cardiovascular negative cardio ROS  Rhythm:Regular Rate:Normal     Neuro/Psych negative neurological ROS  negative psych ROS   GI/Hepatic negative GI ROS,   Endo/Other  negative endocrine ROS  Renal/GU negative Renal ROS  negative genitourinary   Musculoskeletal  (+) Arthritis -, Osteoarthritis,    Abdominal   Peds negative pediatric ROS (+)  Hematology negative hematology ROS (+)   Anesthesia Other Findings   Reproductive/Obstetrics negative OB ROS                             Anesthesia Physical Anesthesia Plan  ASA: II and emergent  Anesthesia Plan: General   Post-op Pain Management:    Induction: Intravenous, Rapid sequence and Cricoid pressure planned  Airway Management Planned: Oral ETT  Additional Equipment:   Intra-op Plan:   Post-operative Plan: Extubation in OR  Informed Consent: I have reviewed the patients History and Physical, chart, labs and discussed the procedure including the risks, benefits and alternatives for the proposed anesthesia with the patient or authorized representative who has indicated his/her understanding and acceptance.   Dental advisory given  Plan Discussed with: CRNA  Anesthesia Plan Comments:        Anesthesia Quick Evaluation

## 2014-03-13 NOTE — Transfer of Care (Signed)
Immediate Anesthesia Transfer of Care Note  Patient: Veronica Phelps  Procedure(s) Performed: Procedure(s) (LRB): APPENDECTOMY LAPAROSCOPIC (N/A)  Patient Location: PACU  Anesthesia Type: General  Level of Consciousness: sedated, patient cooperative and responds to stimulation  Airway & Oxygen Therapy: Patient Spontanous Breathing and Patient connected to face mask oxgen  Post-op Assessment: Report given to PACU RN and Post -op Vital signs reviewed and stable  Post vital signs: Reviewed and stable  Complications: No apparent anesthesia complications

## 2014-03-13 NOTE — H&P (Signed)
[facility] Admission Note  Veronica Phelps Nov 26, 1945  [id number].    Requesting MD: Dr. Ashok Cordia Chief Complaint/Reason for Consult: Acute appendicitis  HPI:  69 y/o white female with PMH OA, RIH repair x2, diag lap for uterus mass who presents to the [facility] with 48 hours of generalized abdominal pain which has now settled in the RLQ and remains constant and severe.  She denies any precipitating, alleviating factors, and no radiating pain.  She denies N/V/D CP/SOB, rash, headache.  She says only constipation and temp of 98*F.  She has never had anything like this before.  She denies any abdominal/gastrointestinal problems.  Since hernias were fixed denies any problems with re-occurrence.  Notes in 1993 she had a diagnostic laparoscopy for a cyst or mass on her uterus.  They did no resection, but she believes they did biopsy which was normal.  She denies knowing about a dermoid cyst on her right ovary.  She thinks she stopped mensurating at age 21-61 y/o.  Denies any other GYN related problems.    Her CT scan is consistent with acute appendicitis.  She also has  4.3cm dermoid cystic structure on her right ovary which is consistent with a teratoma.     ROS: All systems reviewed and otherwise negative except for as above  No family history on file.  Past Medical History  Diagnosis Date  . Complication of anesthesia     takes awhile to wake up   . Arthritis     oa    Past Surgical History  Procedure Laterality Date  . Hernia repair      1970s x2 -77 y/o  . Laparoscopy      1993 - Uterus mass  . Knee arthroscopy w/ meniscal repair      left    11/2011  . Total knee arthroplasty Left 03/14/2013    Procedure: LEFT TOTAL KNEE ARTHROPLASTY;  Surgeon: Vickey Huger, MD;  Location: [place];  Service: Orthopedics;  Laterality: Left;    Social History:  reports that she has never smoked. She does not have any smokeless tobacco history on file. She reports that she does not drink alcohol  or use illicit drugs.  Allergies:  Allergies  Allergen Reactions  . Oxycontin [Oxycodone Hcl] Other (See Comments)    Dizziness  . Ultram [Tramadol] Other (See Comments)    Dizziness, and headaches     (Not in a hospital admission)  Blood pressure 143/73, pulse 71, temperature 97.9 F (36.6 C), temperature source Oral, resp. rate 18, SpO2 100 %.   Physical Exam: General: pleasant, WD/WN white female who is laying in bed in NAD HEENT: head is normocephalic, atraumatic.  Sclera are noninjected.   PERRL.  Ears and nose without any masses or lesions.  Mouth is pink and moist Heart: regular, rate, and rhythm.  No obvious murmurs, gallops, or rubs noted.  Palpable pedal pulses bilaterally Lungs: CTAB, no wheezes, rhonchi, or rales noted.  Respiratory effort nonlabored Abd: soft, tender in the RLQ, ND, +BS, no masses, hernias, or organomegaly, surgical scar well healed in right groin and at umbilicus, rest of scars not identifiable MS: all 4 extremities are symmetrical with no cyanosis, clubbing, or edema. Skin: warm and dry with no masses, lesions, or rashes Psych: A&Ox3 with an appropriate affect.   Results for orders placed or performed during the hospital encounter of 03/13/14 (from the past 48 hour(s))  CBC     Status: None   Collection Time: 03/13/14  2:01  PM  Result Value Ref Range   WBC 6.1 4.0 - 10.5 K/uL   RBC 4.47 3.87 - 5.11 MIL/uL   Hemoglobin 14.2 12.0 - 15.0 g/dL   HCT 42.3 36.0 - 46.0 %   MCV 94.6 78.0 - 100.0 fL   MCH 31.8 26.0 - 34.0 pg   MCHC 33.6 30.0 - 36.0 g/dL   RDW 13.0 11.5 - 15.5 %   Platelets 259 150 - 400 K/uL  Comprehensive metabolic panel     Status: Abnormal   Collection Time: 03/13/14  2:01 PM  Result Value Ref Range   Sodium 141 135 - 145 mmol/L   Potassium 3.8 3.5 - 5.1 mmol/L   Chloride 105 96 - 112 mmol/L   CO2 27 19 - 32 mmol/L   Glucose, Bld 95 70 - 99 mg/dL   BUN 14 6 - 23 mg/dL   Creatinine, Ser 0.66 0.50 - 1.10 mg/dL   Calcium 9.4  8.4 - 10.5 mg/dL   Total Protein 7.0 6.0 - 8.3 g/dL   Albumin 4.2 3.5 - 5.2 g/dL   AST 35 0 - 37 U/L   ALT 50 (H) 0 - 35 U/L   Alkaline Phosphatase 99 39 - 117 U/L   Total Bilirubin 1.1 0.3 - 1.2 mg/dL   GFR calc non Af Amer 89 (L) >90 mL/min   GFR calc Af Amer >90 >90 mL/min    Comment: (NOTE) The eGFR has been calculated using the CKD EPI equation. This calculation has not been validated in all clinical situations. eGFR's persistently <90 mL/min signify possible Chronic Kidney Disease.    Anion gap 9 5 - 15  Urinalysis, Routine w reflex microscopic     Status: Abnormal   Collection Time: 03/13/14  2:57 PM  Result Value Ref Range   Color, Urine YELLOW YELLOW   APPearance CLEAR CLEAR   Specific Gravity, Urine 1.008 1.005 - 1.030   pH 7.0 5.0 - 8.0   Glucose, UA NEGATIVE NEGATIVE mg/dL   Hgb urine dipstick NEGATIVE NEGATIVE   Bilirubin Urine NEGATIVE NEGATIVE   Ketones, ur NEGATIVE NEGATIVE mg/dL   Protein, ur NEGATIVE NEGATIVE mg/dL   Urobilinogen, UA 0.2 0.0 - 1.0 mg/dL   Nitrite NEGATIVE NEGATIVE   Leukocytes, UA TRACE (A) NEGATIVE  Urine microscopic-add on     Status: None   Collection Time: 03/13/14  2:57 PM  Result Value Ref Range   Squamous Epithelial / LPF RARE RARE   WBC, UA 0-2 <3 WBC/hpf   Ct Abdomen Pelvis W Contrast  03/13/2014   CLINICAL DATA:  69 year old female with right lower quadrant pain for 2 days. Query appendicitis. Personal history of hernia repair. Initial encounter.  EXAM: CT ABDOMEN AND PELVIS WITH CONTRAST  TECHNIQUE: Multidetector CT imaging of the abdomen and pelvis was performed using the standard protocol following bolus administration of intravenous contrast.  CONTRAST:  9m OMNIPAQUE IOHEXOL 300 MG/ML SOLN, 1065mOMNIPAQUE IOHEXOL 300 MG/ML SOLN  COMPARISON:  None.  FINDINGS: Atelectasis at both lung bases. Mild cardiomegaly. No pericardial or pleural effusion.  Degenerative changes in the lower lumbar spine. No acute osseous abnormality  identified.  No pelvic free fluid.  Negative rectum.  Uterus and left adnexal within normal limits. There is a 4.3 cm diameter macroscopic fat containing mass of the right adnexa (series 2, image 66). Unremarkable bladder.  Sigmoid diverticulosis, no active inflammation identified. Negative left colon. Mild motion artifact in the abdomen. Negative transverse colon. Negative right colon. However, the appendix is  dilated and mildly inflamed. His peripherally enhancing measuring up to 10 mm in size and there is mild surrounding fat stranding.  The terminal ileum appears unaffected and normal. No dilated small bowel. Negative stomach, duodenum, liver, gallbladder, spleen, pancreas, adrenal glands, and portal venous system. Aortoiliac calcified atherosclerosis noted. Major arterial structures are patent. Left greater than right renal parapelvic cysts. Nonobstructing 2-3 mm left renal mid and lower pole calculi. Symmetric renal contrast excretion. No abdominal free fluid.  IMPRESSION: 1. Positive for acute appendicitis. No free fluid or complicating features. 2. 4.3 cm right ovarian dermoid cyst (mature cystic ovarian teratoma). 3. Left nephrolithiasis.   Electronically Signed   By: Genevie Ann M.D.   On: 03/13/2014 15:36    Assessment/Plan Acute appendicitis Right ovarian dermoid cyst - mature cystic ovarian teratoma 4.3 cm H/o 2 x RIH repair H/o laparoscopy for uterine biopsy  Plan: 1.  Admit to [facility], urgent lap appy for appendicitis 2.  NPO, bowel rest, IVF, pain control, antiemetics, antibiotics (Cefoxitin) 3.  SCD's 4.  Ambulate and IS 5.  Discussed with her about her right ovarian dermoid cyst (teratoma) and how she will likely need to follow up with GYN upon discharge ASAP.  She understands this.  Coralie Keens, [facility] Surgery 03/13/2014, 4:27 PM Pager: [phone]  Agree with above. I discussed with the patient the indications and risks of appendiceal surgery.  The primary risks of  appendiceal surgery include, but are not limited to, bleeding, infection, bowel surgery, and open surgery.  There is also the risk that the patient may have continued symptoms after surgery. We discussed the typical post-operative recovery course. I tried to answer the patient's questions. I told her that my partner, Dr. Zella Richer, would see her and do the surgery. Her husband is in the room with her.  Alphonsa Overall, MD, [facility] Surgery Pager: [phone] Office phone:  [phone]

## 2014-03-13 NOTE — Anesthesia Postprocedure Evaluation (Signed)
  Anesthesia Post-op Note  Patient: Veronica Phelps  Procedure(s) Performed: Procedure(s) (LRB): APPENDECTOMY LAPAROSCOPIC (N/A)  Patient Location: PACU  Anesthesia Type: General  Level of Consciousness: awake and alert   Airway and Oxygen Therapy: Patient Spontanous Breathing  Post-op Pain: mild  Post-op Assessment: Post-op Vital signs reviewed, Patient's Cardiovascular Status Stable, Respiratory Function Stable, Patent Airway and No signs of Nausea or vomiting  Last Vitals:  Filed Vitals:   03/13/14 1945  BP: 148/114  Pulse: 78  Temp:   Resp: 13    Post-op Vital Signs: stable   Complications: No apparent anesthesia complications

## 2014-03-14 ENCOUNTER — Encounter (HOSPITAL_COMMUNITY): Payer: Self-pay | Admitting: General Surgery

## 2014-03-14 DIAGNOSIS — N858 Other specified noninflammatory disorders of uterus: Secondary | ICD-10-CM | POA: Diagnosis present

## 2014-03-14 DIAGNOSIS — D270 Benign neoplasm of right ovary: Secondary | ICD-10-CM | POA: Diagnosis present

## 2014-03-14 DIAGNOSIS — Z9049 Acquired absence of other specified parts of digestive tract: Secondary | ICD-10-CM

## 2014-03-14 MED ORDER — HYDROCODONE-ACETAMINOPHEN 5-325 MG PO TABS: 1.0000 | ORAL_TABLET | Freq: Four times a day (QID) | ORAL | Status: DC | PRN

## 2014-03-14 MED ORDER — ACETAMINOPHEN 325 MG PO TABS
650.0000 mg | ORAL_TABLET | ORAL | Status: DC | PRN
  Administered 2014-03-14 (×2): 650 mg via ORAL
  Filled 2014-03-14 (×2): qty 2

## 2014-03-14 NOTE — Discharge Instructions (Signed)

## 2014-03-14 NOTE — Discharge Summary (Signed)
[facility] Discharge Summary   Patient ID: Veronica Phelps MRN: [id number] DOB/AGE: 09-08-1945 69 y.o.  Admit date: 03/13/2014 Discharge date: 03/14/2014  Admitting Diagnosis: Acute appendicitis Dermoid cyst right ovary (teratoma) Osteoarthritis knee  Discharge Diagnosis Patient Active Problem List   Diagnosis Date Noted  . Dermoid cyst of right ovary 03/14/2014  . Mass of uterus 03/14/2014  . S/P appy 03/14/2014  . Appendicitis 03/13/2014  . Acute appendicitis 03/13/2014  . S/P total knee arthroplasty 03/14/2013    Consultants None  Imaging: Ct Abdomen Pelvis W Contrast  03/13/2014   CLINICAL DATA:  69 year old female with right lower quadrant pain for 2 days. Query appendicitis. Personal history of hernia repair. Initial encounter.  EXAM: CT ABDOMEN AND PELVIS WITH CONTRAST  TECHNIQUE: Multidetector CT imaging of the abdomen and pelvis was performed using the standard protocol following bolus administration of intravenous contrast.  CONTRAST:  18mL OMNIPAQUE IOHEXOL 300 MG/ML SOLN, 18mL OMNIPAQUE IOHEXOL 300 MG/ML SOLN  COMPARISON:  None.  FINDINGS: Atelectasis at both lung bases. Mild cardiomegaly. No pericardial or pleural effusion.  Degenerative changes in the lower lumbar spine. No acute osseous abnormality identified.  No pelvic free fluid.  Negative rectum.  Uterus and left adnexal within normal limits. There is a 4.3 cm diameter macroscopic fat containing mass of the right adnexa (series 2, image 66). Unremarkable bladder.  Sigmoid diverticulosis, no active inflammation identified. Negative left colon. Mild motion artifact in the abdomen. Negative transverse colon. Negative right colon. However, the appendix is dilated and mildly inflamed. His peripherally enhancing measuring up to 10 mm in size and there is mild surrounding fat stranding.  The terminal ileum appears unaffected and normal. No dilated small bowel. Negative stomach, duodenum, liver, gallbladder,  spleen, pancreas, adrenal glands, and portal venous system. Aortoiliac calcified atherosclerosis noted. Major arterial structures are patent. Left greater than right renal parapelvic cysts. Nonobstructing 2-3 mm left renal mid and lower pole calculi. Symmetric renal contrast excretion. No abdominal free fluid.  IMPRESSION: 1. Positive for acute appendicitis. No free fluid or complicating features. 2. 4.3 cm right ovarian dermoid cyst (mature cystic ovarian teratoma). 3. Left nephrolithiasis.   Electronically Signed   By: Genevie Ann M.D.   On: 03/13/2014 15:36    Procedures Dr. Zella Richer (03/13/14) - Laparoscopic Appendectomy  Hospital Course:  69 y/o white female with PMH OA, RIH repair x2, diag lap for uterus mass who presents to the [facility] with 48 hours of generalized abdominal pain which has now settled in the RLQ and remains constant and severe. She denies any precipitating, alleviating factors, and no radiating pain. She denies N/V/D CP/SOB, rash, headache. She says only constipation and temp of 98*F. She has never had anything like this before. She denies any abdominal/gastrointestinal problems. Since hernias were fixed denies any problems with re-occurrence. Notes in 1993 she had a diagnostic laparoscopy for a cyst or mass on her uterus. They did no resection, but she believes they did biopsy which was normal. She denies knowing about a dermoid cyst on her right ovary. She thinks she stopped mensurating at age 77-61 y/o. Denies any other GYN related problems.   Her CT scan is consistent with acute appendicitis. She also has 4.3cm dermoid cystic structure on her right ovary which is consistent with a teratoma. Patient was admitted and underwent procedure listed above.  Tolerated procedure well and was transferred to the floor.  Dr. Zella Richer evaluated the right ovarian mass, but also found a small tumor on the anterior aspect  of the uterus.  Pictures of these were taken.   Diet was  advanced as tolerated.  On POD #1, the patient was voiding well, tolerating diet, ambulating well, pain well controlled, vital signs stable, incisions c/d/i and felt stable for discharge home.  Patient will follow up in our office with Dr. Zella Richer in 2-3 weeks and knows to call with questions or concerns.  She will need urgent follow up with GYN which I will have our office arrange with the patient.    Physical Exam: General:  Alert, NAD, pleasant, comfortable Abd:  Soft, ND, mild tenderness, incisions C/D/I    Medication List    STOP taking these medications        celecoxib 200 MG capsule  Commonly known as:  CELEBREX     enoxaparin 40 MG/0.4ML injection  Commonly known as:  LOVENOX     HYDROcodone-acetaminophen 10-325 MG per tablet  Commonly known as:  NORCO  Replaced by:  HYDROcodone-acetaminophen 5-325 MG per tablet     methocarbamol 500 MG tablet  Commonly known as:  ROBAXIN      TAKE these medications        acetaminophen 500 MG tablet  Commonly known as:  TYLENOL  Take 1,000 mg by mouth daily as needed (for constant).     HYDROcodone-acetaminophen 5-325 MG per tablet  Commonly known as:  NORCO/VICODIN  Take 1-2 tablets by mouth every 6 (six) hours as needed for moderate pain.         Follow-up Information    Follow up with ROSENBOWER,TODD J, MD. Schedule an appointment as soon as possible for a visit in 2 weeks.   Specialty:  General Surgery   Why:  For post-operation check.     Contact information:   [address] [place] [address] [phone]       Please follow up.   Why:  PLEASE ARRANGE WITH OUR OFFICE FOR AN APPOINTMENT WITH GYN ASAP. REGARDING YOUR RIGHT OVARIAN DERMOID CYST     Signed: Coralie Keens [facility] Surgery [phone]  03/14/2014, 9:28 AM  Agree with above. She did well.  Alphonsa Overall, MD, [facility] Surgery Pager: [phone] Office phone:  [phone]

## 2014-03-14 NOTE — Progress Notes (Signed)
Patient discharge instructions and prescription provided to patient.  Questions answered.  Patient discharged via wheelchair

## 2014-03-16 ENCOUNTER — Other Ambulatory Visit (HOSPITAL_COMMUNITY)
Admission: RE | Admit: 2014-03-16 | Discharge: 2014-03-16 | Disposition: A | Discharge status: Home / Self Care | Payer: Medicare Other | Source: Ambulatory Visit | Attending: Obstetrics & Gynecology | Admitting: Obstetrics & Gynecology

## 2014-03-16 ENCOUNTER — Other Ambulatory Visit: Payer: Self-pay | Admitting: Obstetrics & Gynecology

## 2014-03-16 DIAGNOSIS — Z01419 Encounter for gynecological examination (general) (routine) without abnormal findings: Secondary | ICD-10-CM | POA: Diagnosis present

## 2014-03-16 DIAGNOSIS — Z1151 Encounter for screening for human papillomavirus (HPV): Secondary | ICD-10-CM | POA: Diagnosis present

## 2014-03-17 LAB — CYTOLOGY - PAP

## 2014-05-12 NOTE — Patient Instructions (Addendum)
   Your procedure is scheduled on:  Wednesday, April 27  Enter through the [employer] [facility] at: 6 AM Pick up the phone at the desk and dial [phone] and inform us of your arrival.  Please call this number if you have any problems the morning of surgery: [phone]  Remember: Do not eat or drink after midnight: Tuesday Take these medicines the morning of surgery with a SIP OF WATER:  NONE  Do not wear jewelry, make-up, or FINGER nail polish No metal in your hair or on your body. Do not wear lotions, powders, perfumes.  You may wear deodorant.  Do not bring valuables to the hospital. Contacts, dentures or bridgework may not be worn into surgery.  Patients discharged on the day of surgery will not be allowed to drive home.  Home with husband Antony Haste cell [phone]

## 2014-05-16 ENCOUNTER — Other Ambulatory Visit: Payer: Self-pay

## 2014-05-16 ENCOUNTER — Encounter (HOSPITAL_COMMUNITY): Payer: Self-pay

## 2014-05-16 ENCOUNTER — Encounter (HOSPITAL_COMMUNITY)
Admission: RE | Admit: 2014-05-16 | Discharge: 2014-05-16 | Disposition: A | Discharge status: Home / Self Care | Payer: Medicare Other | Source: Ambulatory Visit | Attending: Obstetrics & Gynecology | Admitting: Obstetrics & Gynecology

## 2014-05-16 DIAGNOSIS — Z01818 Encounter for other preprocedural examination: Secondary | ICD-10-CM | POA: Insufficient documentation

## 2014-05-16 HISTORY — DX: Unspecified ovarian cyst, left side: N83.202

## 2014-05-16 HISTORY — DX: Unspecified ovarian cyst, right side: N83.201

## 2014-05-16 HISTORY — DX: Anemia, unspecified: D64.9

## 2014-05-16 LAB — BASIC METABOLIC PANEL
Anion gap: 7 (ref 5–15)
BUN: 25 mg/dL — AB (ref 6–23)
CO2: 28 mmol/L (ref 19–32)
Calcium: 9.2 mg/dL (ref 8.4–10.5)
Chloride: 105 mmol/L (ref 96–112)
Creatinine, Ser: 0.8 mg/dL (ref 0.50–1.10)
GFR calc Af Amer: 86 mL/min — ABNORMAL LOW (ref >90)
GFR calc non Af Amer: 74 mL/min — ABNORMAL LOW (ref >90)
GLUCOSE: 114 mg/dL — AB (ref 70–99)
POTASSIUM: 4.3 mmol/L (ref 3.5–5.1)
Sodium: 140 mmol/L (ref 135–145)

## 2014-05-16 LAB — CBC
HCT: 42.2 % (ref 36.0–46.0)
Hemoglobin: 14.1 g/dL (ref 12.0–15.0)
MCH: 30.9 pg (ref 26.0–34.0)
MCHC: 33.4 g/dL (ref 30.0–36.0)
MCV: 92.5 fL (ref 78.0–100.0)
Platelets: 269 10*3/uL (ref 150–400)
RBC: 4.56 MIL/uL (ref 3.87–5.11)
RDW: 13.5 % (ref 11.5–15.5)
WBC: 5.4 10*3/uL (ref 4.0–10.5)

## 2014-05-16 NOTE — H&P (Signed)
GYN H&P  69yo postmenopausal female who presents for laparoscopic bilateral salpingo-oophorectomy due to incidental finding of complex ovarian cyst.  In review, she recently had an acute appendicitis where by CT and surgical confirmation, a right adnexal mass was discovered. Prior to surgery- she was asymptomatic from the cyst- no abdominal or pelvic pain, no bloating. No vaginal discharge, itching or burning and has recovered from her appendectomy without any complications.  OR pictures reviewed with patient- right ovary ~ 4cm (enlarged, but overall normal apperance) 61mm pedunculated fibroid on anterior portion of body of the uterus. She reports no acute complaints.  After discussion regarding surgical vs conservative management, she wishes to proceed with surgical management.  Past Medical History: none  .   Surgical History  knee surgery left knee 11/2011  appendectomy 03/13/2014   Family History  Father: deceased  Mother: deceased, diagnosed with Colon Ca  denies any GYN family cancer hx.   Social History  General:  Tobacco use  cigarettes: Never smoked Tobacco history last updated 03/16/2014 no Smoking.  no Tobacco Exposure.  no Alcohol.  no Recreational drug use.    Gyn History  Sexual activity not currently sexually active.  Periods : postmenopausal.  LMP 2006.  Denies H/O Birth control.  Last pap smear date 2011.  Last mammogram date 2011.  Denies H/O Abnormal pap smear.    OB History  Pregnancy # 1 live birth, vaginal delivery.  Pregnancy # 2 live birth, vaginal delivery.  Pregnancy # 3 live birth, vaginal delivery.  Pregnancy # 4: live birth, vaginal delivery.    Allergies  Darvon: passed out: Allergy  Darvocet-N 100: passed out: Allergy  Ultram: passed out: Allergy    Review of Systems  CONSTITUTIONAL:  no Chills. no Fever. no Skin rash.  HEENT:  Blurrred vision no.  CARDIOLOGY:  no Chest pain.  RESPIRATORY:  no Shortness of breath. no Cough.   GASTROENTEROLOGY:  no Appetite change. no Change in bowel movements.  UROLOGY:  no Urinary frequency. no Urinary incontinence. no Urinary urgency.  FEMALE REPRODUCTIVE:  no Breast lumps or discharge. no Breast pain.  NEUROLOGY:  no Dizziness. no Headache.     O: Examination performed 05/17/14: Vital Signs Wt 190, Wt change 1.2 lb, Ht 70, BMI 27.26, Temp 97.5, Pulse sitting 76, BP sitting 117/70. General Examination: GENERAL APPEARANCE alert, oriented, NAD, pleasant.  SKIN: normal, no rash.  BREASTS: no palpable masses bilaterally, no nipple discharge, no lymphadenopathy bilaterally.  LUNGS: clear to auscultation bilaterally, no wheezes, rhonchi, rales.  HEART: no murmurs, regular rate and rhythm.  ABDOMEN: soft and non-tender, no rebound, no guarding, incisions healed.  FEMALE GENITOURINARY: No external lesions, Vagina - pink moist mucosa, no lesions or abnormal discharge, cervix - closed, no discharge or lesions, stage II uterine prolapse. No CMT. No adnexal mass or fullness appreciated on pelvic examination. Uterus: nontender and normal size on palpation.  EXTREMITIES: no edema present, no calf tenderness bilaterally.    Labs/Imaging:  Abd CT: 2/15: a 4.3 cm right ovarian cyst with macroscopic fat suggestive of a mature cystic ovarian teratoma, no free fluid.   CA125 (2/25): 28  CBC    Component Value Date/Time   WBC 5.4 05/16/2014 0935   RBC 4.56 05/16/2014 0935   HGB 14.1 05/16/2014 0935   HCT 42.2 05/16/2014 0935   PLT 269 05/16/2014 0935   MCV 92.5 05/16/2014 0935   MCH 30.9 05/16/2014 0935   MCHC 33.4 05/16/2014 0935   RDW 13.5 05/16/2014 0935  LYMPHSABS 1.6 03/04/2013 0847   MONOABS 0.5 03/04/2013 0847   EOSABS 0.3 03/04/2013 0847   BASOSABS 0.0 03/04/2013 0847    A/P: 69yo postmenopausal female who presents for laparoscopic bilateral salpingo-oophorectomy due to ovarian cyst. -NPO -LR @ 125cc/hr -SCDs to OR -antibiotics not indicated -Risk, benefit and  indications reviewed with patient including but not limited to risk of bleeding, infection and injury to surrounding organs as well as potential need for further surgery.  Questions and concerns were addressed.  Janyth Pupa, DO [phone] (pager) [phone] (office)

## 2014-05-24 ENCOUNTER — Encounter (HOSPITAL_COMMUNITY): Payer: Self-pay | Admitting: Certified Registered Nurse Anesthetist

## 2014-05-24 ENCOUNTER — Ambulatory Visit (HOSPITAL_COMMUNITY): Payer: Medicare Other | Admitting: Certified Registered Nurse Anesthetist

## 2014-05-24 ENCOUNTER — Ambulatory Visit (HOSPITAL_COMMUNITY)
Admission: RE | Admit: 2014-05-24 | Discharge: 2014-05-24 | Disposition: A | Discharge status: Home / Self Care | Payer: Medicare Other | Source: Ambulatory Visit | Attending: Obstetrics & Gynecology | Admitting: Obstetrics & Gynecology

## 2014-05-24 ENCOUNTER — Encounter (HOSPITAL_COMMUNITY)
Admission: RE | Disposition: A | Discharge status: Home / Self Care | Payer: Self-pay | Source: Ambulatory Visit | Attending: Obstetrics & Gynecology

## 2014-05-24 DIAGNOSIS — N838 Other noninflammatory disorders of ovary, fallopian tube and broad ligament: Secondary | ICD-10-CM | POA: Diagnosis not present

## 2014-05-24 DIAGNOSIS — N736 Female pelvic peritoneal adhesions (postinfective): Secondary | ICD-10-CM | POA: Insufficient documentation

## 2014-05-24 DIAGNOSIS — Z9089 Acquired absence of other organs: Secondary | ICD-10-CM | POA: Insufficient documentation

## 2014-05-24 DIAGNOSIS — N8329 Other ovarian cysts: Secondary | ICD-10-CM | POA: Diagnosis present

## 2014-05-24 DIAGNOSIS — N8320 Unspecified ovarian cysts: Secondary | ICD-10-CM | POA: Insufficient documentation

## 2014-05-24 DIAGNOSIS — D270 Benign neoplasm of right ovary: Secondary | ICD-10-CM | POA: Insufficient documentation

## 2014-05-24 DIAGNOSIS — D259 Leiomyoma of uterus, unspecified: Secondary | ICD-10-CM | POA: Insufficient documentation

## 2014-05-24 HISTORY — PX: LAPAROSCOPIC BILATERAL SALPINGO OOPHERECTOMY: SHX5890

## 2014-05-24 SURGERY — SALPINGO-OOPHORECTOMY, BILATERAL, LAPAROSCOPIC
Anesthesia: General | Site: Abdomen | Laterality: Bilateral

## 2014-05-24 MED ORDER — FENTANYL CITRATE (PF) 250 MCG/5ML IJ SOLN
INTRAMUSCULAR | Status: AC
  Filled 2014-05-24: qty 5

## 2014-05-24 MED ORDER — LACTATED RINGERS IV SOLN
INTRAVENOUS | Status: DC
  Administered 2014-05-24 (×2): via INTRAVENOUS

## 2014-05-24 MED ORDER — LIDOCAINE HCL (CARDIAC) 20 MG/ML IV SOLN
INTRAVENOUS | Status: DC | PRN
  Administered 2014-05-24: 50 mg via INTRAVENOUS

## 2014-05-24 MED ORDER — PROPOFOL 10 MG/ML IV BOLUS
INTRAVENOUS | Status: AC
  Filled 2014-05-24: qty 20

## 2014-05-24 MED ORDER — LACTATED RINGERS IV SOLN
INTRAVENOUS | Status: DC
  Administered 2014-05-24: 06:00:00 via INTRAVENOUS

## 2014-05-24 MED ORDER — GLYCOPYRROLATE 0.2 MG/ML IJ SOLN
INTRAMUSCULAR | Status: AC
  Filled 2014-05-24: qty 3

## 2014-05-24 MED ORDER — ONDANSETRON HCL 4 MG/2ML IJ SOLN
INTRAMUSCULAR | Status: AC
  Filled 2014-05-24: qty 2

## 2014-05-24 MED ORDER — MIDAZOLAM HCL 2 MG/2ML IJ SOLN
INTRAMUSCULAR | Status: AC
  Filled 2014-05-24: qty 2

## 2014-05-24 MED ORDER — SODIUM CHLORIDE 0.9 % IJ SOLN
INTRAMUSCULAR | Status: DC | PRN
  Administered 2014-05-24: 10 mL via INTRAVENOUS

## 2014-05-24 MED ORDER — LACTATED RINGERS IR SOLN
Status: DC | PRN
  Administered 2014-05-24: 1

## 2014-05-24 MED ORDER — ACETAMINOPHEN-CODEINE #3 300-30 MG PO TABS: 1.0000 | ORAL_TABLET | Freq: Four times a day (QID) | ORAL | Status: DC | PRN

## 2014-05-24 MED ORDER — DEXAMETHASONE SODIUM PHOSPHATE 4 MG/ML IJ SOLN
INTRAMUSCULAR | Status: AC
  Filled 2014-05-24: qty 1

## 2014-05-24 MED ORDER — FENTANYL CITRATE (PF) 100 MCG/2ML IJ SOLN
INTRAMUSCULAR | Status: DC | PRN
  Administered 2014-05-24 (×5): 50 ug via INTRAVENOUS

## 2014-05-24 MED ORDER — ONDANSETRON HCL 4 MG/2ML IJ SOLN
INTRAMUSCULAR | Status: DC | PRN
  Administered 2014-05-24: 4 mg via INTRAVENOUS

## 2014-05-24 MED ORDER — FENTANYL CITRATE (PF) 100 MCG/2ML IJ SOLN: 25.0000 ug | INTRAMUSCULAR | Status: DC | PRN

## 2014-05-24 MED ORDER — NEOSTIGMINE METHYLSULFATE 10 MG/10ML IV SOLN
INTRAVENOUS | Status: AC
  Filled 2014-05-24: qty 1

## 2014-05-24 MED ORDER — OXYCODONE-ACETAMINOPHEN 5-325 MG PO TABS: 1.0000 | ORAL_TABLET | Freq: Four times a day (QID) | ORAL | Status: DC | PRN

## 2014-05-24 MED ORDER — BUPIVACAINE HCL (PF) 0.25 % IJ SOLN
INTRAMUSCULAR | Status: AC
  Filled 2014-05-24: qty 30

## 2014-05-24 MED ORDER — BUPIVACAINE HCL (PF) 0.25 % IJ SOLN
INTRAMUSCULAR | Status: DC | PRN
  Administered 2014-05-24: 10 mL
  Administered 2014-05-24: 20 mL

## 2014-05-24 MED ORDER — PROPOFOL 10 MG/ML IV BOLUS
INTRAVENOUS | Status: DC | PRN
  Administered 2014-05-24: 180 mg via INTRAVENOUS

## 2014-05-24 MED ORDER — LIDOCAINE HCL (PF) 1 % IJ SOLN
INTRAMUSCULAR | Status: AC
  Filled 2014-05-24: qty 5

## 2014-05-24 MED ORDER — SCOPOLAMINE 1 MG/3DAYS TD PT72
MEDICATED_PATCH | TRANSDERMAL | Status: AC
  Administered 2014-05-24: 1.5 mg
  Filled 2014-05-24: qty 1

## 2014-05-24 MED ORDER — IBUPROFEN 600 MG PO TABS: 600.0000 mg | ORAL_TABLET | Freq: Four times a day (QID) | ORAL | Status: DC | PRN

## 2014-05-24 MED ORDER — DOCUSATE SODIUM 100 MG PO CAPS: 100.0000 mg | ORAL_CAPSULE | Freq: Two times a day (BID) | ORAL | Status: DC

## 2014-05-24 MED ORDER — EPHEDRINE SULFATE 50 MG/ML IJ SOLN
INTRAMUSCULAR | Status: DC | PRN
  Administered 2014-05-24: 5 mg via INTRAVENOUS
  Administered 2014-05-24: 10 mg via INTRAVENOUS

## 2014-05-24 MED ORDER — ROCURONIUM BROMIDE 100 MG/10ML IV SOLN
INTRAVENOUS | Status: DC | PRN
  Administered 2014-05-24: 45 mg via INTRAVENOUS

## 2014-05-24 MED ORDER — DEXAMETHASONE SODIUM PHOSPHATE 10 MG/ML IJ SOLN
INTRAMUSCULAR | Status: DC | PRN
  Administered 2014-05-24: 4 mg via INTRAVENOUS

## 2014-05-24 MED ORDER — MIDAZOLAM HCL 2 MG/2ML IJ SOLN
INTRAMUSCULAR | Status: DC | PRN
  Administered 2014-05-24 (×2): 1 mg via INTRAVENOUS

## 2014-05-24 MED ORDER — 0.9 % SODIUM CHLORIDE (POUR BTL) OPTIME
TOPICAL | Status: DC | PRN
  Administered 2014-05-24: 1000 mL

## 2014-05-24 SURGICAL SUPPLY — 30 items
BAG SPEC RTRVL LRG 6X4 10 (ENDOMECHANICALS) ×1
CLOTH BEACON ORANGE TIMEOUT ST (SAFETY) ×3 IMPLANT
DEVICE TROCAR PUNCTURE CLOSURE (ENDOMECHANICALS) ×2 IMPLANT
DRSG COVADERM PLUS 2X2 (GAUZE/BANDAGES/DRESSINGS) ×4 IMPLANT
DRSG OPSITE POSTOP 3X4 (GAUZE/BANDAGES/DRESSINGS) ×2 IMPLANT
DURAPREP 26ML APPLICATOR (WOUND CARE) ×3 IMPLANT
GAUZE VASELINE 3X9 (GAUZE/BANDAGES/DRESSINGS) ×2 IMPLANT
GLOVE BIOGEL PI IND STRL 6.5 (GLOVE) ×2 IMPLANT
GLOVE BIOGEL PI INDICATOR 6.5 (GLOVE) ×4
GLOVE ECLIPSE 6.5 STRL STRAW (GLOVE) ×3 IMPLANT
GOWN STRL REUS W/TWL LRG LVL3 (GOWN DISPOSABLE) ×6 IMPLANT
LIQUID BAND (GAUZE/BANDAGES/DRESSINGS) ×3 IMPLANT
NEEDLE INSUFFLATION 120MM (ENDOMECHANICALS) ×3 IMPLANT
PACK LAPAROSCOPY BASIN (CUSTOM PROCEDURE TRAY) ×3 IMPLANT
PAD POSITIONER PINK NONSTERILE (MISCELLANEOUS) ×3 IMPLANT
POUCH SPECIMEN RETRIEVAL 10MM (ENDOMECHANICALS) ×2 IMPLANT
SET IRRIG TUBING LAPAROSCOPIC (IRRIGATION / IRRIGATOR) ×2 IMPLANT
SHEARS HARMONIC ACE PLUS 36CM (ENDOMECHANICALS) ×2 IMPLANT
SLEEVE XCEL OPT CAN 5 100 (ENDOMECHANICALS) ×2 IMPLANT
SUT MON AB 4-0 PS1 27 (SUTURE) ×5 IMPLANT
SUT VIC AB 0 CT1 27 (SUTURE) ×3
SUT VIC AB 0 CT1 27XBRD ANBCTR (SUTURE) IMPLANT
SUT VIC AB 3-0 CT1 27 (SUTURE) ×3
SUT VIC AB 3-0 CT1 TAPERPNT 27 (SUTURE) IMPLANT
SUT VICRYL 0 UR6 27IN ABS (SUTURE) ×4 IMPLANT
TOWEL OR 17X24 6PK STRL BLUE (TOWEL DISPOSABLE) ×6 IMPLANT
TRAY FOLEY CATH SILVER 14FR (SET/KITS/TRAYS/PACK) ×3 IMPLANT
TROCAR XCEL NON-BLD 11X100MML (ENDOMECHANICALS) ×3 IMPLANT
TROCAR XCEL NON-BLD 5MMX100MML (ENDOMECHANICALS) ×2 IMPLANT
WARMER LAPAROSCOPE (MISCELLANEOUS) ×3 IMPLANT

## 2014-05-24 NOTE — Op Note (Signed)
PREOPERATIVE DIAGNOSIS:  Right complex cyst POSTOPERATIVE DIAGNOSIS: same PROCEDURE PERFORMED: Laparoscopic bilateral salpingo-oophorectomy SURGEON: Dr. Janyth Pupa ASSISTANT:Dr. Christophe Louis ANESTHESIA: General endotracheal.  ESTIMATED BLOOD LOSS: 25cc.  URINE OUTPUT: 200cc of clear yellow urine at the end of the procedure.  IV FLUIDS: 1200cc of crystalloid.  SPECIMEN(S): 1) pelvic washings 2) left fallopian tube and ovary 3) right fallopian tube and ovary COMPLICATIONS: None.  CONDITION: Stable.  FINDINGS: No ascites or peritoneal studding was appreciated.  Liver, gallbladder and bowel appeared grossly normal.  Pelvic adhesions were noted mostly between the left side wall and bowel.  Uterus normal size with a 1cm pedunculated fibroid on the anterior portion of the uterus.  Right ovary was enlarged ~4cm in size, left ovary unremarkable.  Both fallopian tubes were normal.   Informed consent was obtained from the patient prior to taking her to the operating room where anesthesia was found to be adequate. She was placed in dorsal lithotomy position and examined under anesthesia. She was prepped and draped in normal sterile fashion. The bladder was catheterized with a foley under sterile technique.  A bi-valve speculum was then placed and the anterior lip of the cervix was grasped with the single tooth tenaculum. The [place] uterine manipulator was then advanced into the uterus to provide uterine mobility. The speculum and tenaculum were then removed.  Attention was then turned to the patients abdomen where a 10 mm infraumbilical skin incision was made with the scalpel. The veress needle was carefully introduced into the peritoneal cavity while tenting the abdominal wall. Intraperitoneal placement was confirmed by use of a saline-drop test.  The gas was connected and confirmed intrabdominal placement by a low initial pressure of 2 mmHg. The abdomen was then insuflated with CO2 gas. The trocar and sleeve  were then advanced without difficulty into the abdomen under direct visualization. Intraabdominal placement was confirmed by the laparoscope and surveillance of the abdomen was performed with the findings as mentioned above.  25% Marcaine was injected and two additional 68mm skin incision were made in the left and right lower quadrants with placement of the trocar under direct visualization. Pelvic washings were obtained.   The right ovary was then placed on tension and using the Harmonic, the right infundipulopelvic ligament was clamped and ligated. Serial ligations were used for full dissection of the right ovary and fallopian tube.   Attention was turned to the left adnexal.  The pelvic side wall adhesions were limiting the mobility of the left fallopian tube and were taken down using both blunt and sharp dissection.  The left fallopian tube and ovary were better visualized and in a similar fashion, using the harmonic, the left fallopian tube and ovary were dissected. Excellent hemostasis was noted.  The endocatch bag was then placed through the 11 mm port and the specimens were removed in their entirety. In order to remove, the specimen intact, the umbilical incision was extended.  Re-examination of the uterus and abdominal cavity confirmed hemostasis. A defect in the fascia was noted and closed using both the fascial closure device and direct visualization with interrupted stitches of 0-vicryl.  The instruments were removed from the patients abdomen with air allowed to fully escape. The port sites were then closed with monocryl. The umbilical incision was injected with 10cc of 25% marcaine.  The manipulator and foley catheter was then removed from the cervix with no lacerations or bleeding identified. The patient tolerated the procedure well with all sponge, lap, and needle counts correct. The  patient was taken to recovery in stable condition.  Dr. Landry Mellow was present to assist as no residents are available to  our service and due to concern of pelvic adhesions.  Janyth Pupa, DO [phone] (pager) [phone] (office)

## 2014-05-24 NOTE — Anesthesia Procedure Notes (Signed)
Procedure Name: Intubation Date/Time: 05/24/2014 7:27 AM Performed by: Bufford Spikes Pre-anesthesia Checklist: Patient identified, Patient being monitored, Emergency Drugs available, Timeout performed and Suction available Patient Re-evaluated:Patient Re-evaluated prior to inductionOxygen Delivery Method: Circle system utilized Preoxygenation: Pre-oxygenation with 100% oxygen Intubation Type: IV induction Ventilation: Mask ventilation without difficulty Laryngoscope Size: Glidescope and 3 Grade View: Grade III Tube type: Oral Tube size: 6.0 mm Number of attempts: 1 Airway Equipment and Method: Stylet Placement Confirmation: ETT inserted through vocal cords under direct vision,  breath sounds checked- equal and bilateral and positive ETCO2 Secured at: 20 cm Tube secured with: Tape Dental Injury: Teeth and Oropharynx as per pre-operative assessment  Difficulty Due To: Difficulty was anticipated and Difficult Airway- due to anterior larynx Comments: Intubated without difficulty with Glide scope. Vocal cords appear normal.

## 2014-05-24 NOTE — Transfer of Care (Signed)
Immediate Anesthesia Transfer of Care Note  Patient: Veronica Phelps  Procedure(s) Performed: Procedure(s): LAPAROSCOPIC BILATERAL SALPINGO OOPHORECTOMY (Bilateral)  Patient Location: PACU  Anesthesia Type:General  Level of Consciousness: awake and sedated  Airway & Oxygen Therapy: Patient Spontanous Breathing and Patient connected to nasal cannula oxygen  Post-op Assessment: Report given to RN and Post -op Vital signs reviewed and stable  Post vital signs: Reviewed and stable  Last Vitals:  Filed Vitals:   05/24/14 0601  BP: 127/68  Pulse: 66  Temp: 36.8 C  Resp: 20    Complications: No apparent anesthesia complications

## 2014-05-24 NOTE — Anesthesia Preprocedure Evaluation (Signed)
Anesthesia Evaluation  Patient identified by MRN, date of birth, ID band Patient awake    Reviewed: Allergy & Precautions, H&P , Patient's Chart, lab work & pertinent test results, reviewed documented beta blocker date and time   Airway Mallampati: II  TM Distance: >3 FB Neck ROM: full    Dental no notable dental hx.    Pulmonary  breath sounds clear to auscultation  Pulmonary exam normal       Cardiovascular Rhythm:regular Rate:Normal     Neuro/Psych    GI/Hepatic   Endo/Other    Renal/GU      Musculoskeletal   Abdominal   Peds  Hematology   Anesthesia Other Findings   Reproductive/Obstetrics                             Anesthesia Physical Anesthesia Plan  ASA: II  Anesthesia Plan: General   Post-op Pain Management:    Induction: Intravenous  Airway Management Planned: Oral ETT  Additional Equipment:   Intra-op Plan:   Post-operative Plan: Extubation in OR  Informed Consent: I have reviewed the patients History and Physical, chart, labs and discussed the procedure including the risks, benefits and alternatives for the proposed anesthesia with the patient or authorized representative who has indicated his/her understanding and acceptance.   Dental Advisory Given and Dental advisory given  Plan Discussed with: CRNA and Surgeon  Anesthesia Plan Comments: (  Discussed general anesthesia, including possible nausea, instrumentation of airway, sore throat,pulmonary aspiration, etc. I asked if the were any outstanding questions, or  concerns before we proceeded. )        Anesthesia Quick Evaluation

## 2014-05-24 NOTE — Discharge Instructions (Addendum)
HOME INSTRUCTIONS  Please note any unusual or excessive bleeding, pain, swelling. Mild dizziness or drowsiness are normal for about 24 hours after surgery.   Shower when comfortable  Restrictions: No driving for 24 hours or while taking pain medications.  Activity:  No heavy lifting (> 10 lbs), nothing in vagina (no tampons, douching, or intercourse) x 2 weeks; no tub baths for 2 weeks Vaginal spotting is expected but if your bleeding is heavy, period like,  please call the office   Incision: the bandaids will fall off when they are ready to; you may clean your incision with mild soap and water but do not rub or scrub the incision site.  You may experience slight bloody drainage from your incision periodically.  This is normal.  If you experience a large amount of drainage or the incision opens, please call your physician who will likely direct you to the emergency department.  Diet:  You may return to your regular diet.  Do not eat large meals.  Eat small frequent meals throughout the day.  Continue to drink a good amount of water at least 6-8 glasses of water per day, hydration is very important for the healing process.  Pain Management: Take Motrin and/or Percocet as prescribed/needed for pain.  Always take prescription pain medication with food, it may cause constipation, increase fluids and fiber and you may want to take an over-the-counter stool softener like Colace as needed up to 2x a day.    Alcohol -- Avoid for 24 hours and while taking pain medications.  Nausea: Take sips of ginger ale or soda  Fever -- Call physician if temperature over 101 degrees  Follow up:  If you do not already have a follow up appointment scheduled, please call the office at [phone].  If you experience fever (a temperature greater than 100.4), pain unrelieved by pain medication, shortness of breath, swelling of a single leg, or any other symptoms which are concerning to you please the office  immediately.  DISCHARGE INSTRUCTIONS: Laparoscopy  The following instructions have been prepared to help you care for yourself upon your return home today.  Wound care:  Do not get the incisions wet for the first 24 hours. The incisions should be kept clean and dry.  The upper dressing at you umbilicus may be removed after 2 days. The lower side dressings dressings may be removed the day after surgery.  Should any of the incision sites become sore, red, and swollen after the first week, check with your doctor.  Activity and limitations:  Do NOT drive or operate any equipment today.  Walking is encouraged. Walk each day, starting slowly with 5-minute walks 3 or 4 times a day. Slowly increase the length of your walks.  Walk up and down stairs slowly.  Do NOT do strenuous activities, such as golfing, playing tennis, bowling, running, biking, weight lifting, gardening, mowing, or vacuuming for 2-4 weeks. Ask your doctor when it is okay to start.  Return to work: This is dependent on the type of work you do. For the most part you can return to a desk job within a week of surgery. If you are more active at work, please discuss this with your doctor.  What to expect after your surgery: You may have a slight burning sensation when you urinate on the first day. You may have a very small amount of blood in the urine. Expect to have a small amount of vaginal discharge/light bleeding for 1-2 weeks. It  is not unusual to have abdominal soreness and bruising for up to 2 weeks. You may be tired and need more rest for about 1 week. You may experience shoulder pain for 24-72 hours. Lying flat in bed may relieve it.  Call your doctor for any of the following:  Inability to urinate 6 hours after discharge from hospital  Severe pain not relieved by pain medications  Increasing nausea or vomiting  Patient Signature________________________________________ Nurse  Signature_________________________________________

## 2014-05-24 NOTE — Anesthesia Postprocedure Evaluation (Signed)
  Anesthesia Post-op Note  Patient: Veronica Phelps  Procedure(s) Performed: Procedure(s): LAPAROSCOPIC BILATERAL SALPINGO OOPHORECTOMY (Bilateral) Patient is awake and responsive. Pain and nausea are reasonably well controlled. Vital signs are stable and clinically acceptable. Oxygen saturation is clinically acceptable. There are no apparent anesthetic complications at this time. Patient is ready for discharge.

## 2014-05-24 NOTE — Interval H&P Note (Signed)
History and Physical Interval Note:  05/24/2014 6:53 AM  Veronica Phelps  has presented today for surgery, with the diagnosis of N83.20 Right Ovarian Cyst  The various methods of treatment have been discussed with the patient and family. After consideration of risks, benefits and other options for treatment, the patient has consented to  Procedure(s): LAPAROSCOPIC BILATERAL SALPINGO OOPHORECTOMY (Bilateral) as a surgical intervention .  The patient's history has been reviewed, patient examined, no change in status, stable for surgery.  I have reviewed the patient's chart and labs.  Questions were answered to the patient's satisfaction.     Janyth Pupa, M

## 2014-05-25 ENCOUNTER — Encounter (HOSPITAL_COMMUNITY): Payer: Self-pay | Admitting: Obstetrics & Gynecology

## 2014-05-30 ENCOUNTER — Other Ambulatory Visit: Payer: Self-pay | Admitting: Orthopedic Surgery

## 2014-06-07 NOTE — Pre-Procedure Instructions (Signed)
Veronica Phelps  06/07/2014   Your procedure is scheduled on:  Monday, May 16th   Report to [place] at  8:00AM.  Call this number if you have problems the morning of surgery: [phone]   Remember:   Do not eat food or drink liquids after midnight Sunday.    Take these medicines the morning of surgery with A SIP OF WATER: Oxycodone   Do not wear jewelry, make-up or nail polish.  Do not wear lotions, powders, or perfumes. You may NOT wear deodorant the day of surgery.  Do not shave underarms & legs 48 hours prior to surgery.    Do not bring valuables to the hospital.  [facility] is not responsible for any belongings or valuables.               Contacts, dentures or bridgework may not be worn into surgery.  Leave suitcase in the car. After surgery it may be brought to your room.  For patients admitted to the hospital, discharge time is determined by your  treatment team.             Name and phone number of your driver:    Special Instructions: "Preparing for Surgery" instruction sheet.   Please read over the following fact sheets that you were given: Pain Booklet, Coughing and Deep Breathing and Surgical Site Infection Prevention

## 2014-06-08 ENCOUNTER — Encounter (HOSPITAL_COMMUNITY)
Admission: RE | Admit: 2014-06-08 | Discharge: 2014-06-08 | Disposition: A | Discharge status: Home / Self Care | Payer: Medicare Other | Source: Ambulatory Visit | Attending: Orthopedic Surgery | Admitting: Orthopedic Surgery

## 2014-06-08 ENCOUNTER — Encounter (HOSPITAL_COMMUNITY): Payer: Self-pay

## 2014-06-08 DIAGNOSIS — Z01812 Encounter for preprocedural laboratory examination: Secondary | ICD-10-CM | POA: Diagnosis present

## 2014-06-08 DIAGNOSIS — M1712 Unilateral primary osteoarthritis, left knee: Secondary | ICD-10-CM | POA: Insufficient documentation

## 2014-06-08 HISTORY — DX: Nausea with vomiting, unspecified: R11.2

## 2014-06-08 HISTORY — DX: Nausea with vomiting, unspecified: Z98.890

## 2014-06-08 LAB — SURGICAL PCR SCREEN
MRSA, PCR: NEGATIVE
Staphylococcus aureus: NEGATIVE

## 2014-06-08 LAB — CBC
HEMATOCRIT: 41.4 % (ref 36.0–46.0)
HEMOGLOBIN: 13.8 g/dL (ref 12.0–15.0)
MCH: 30.6 pg (ref 26.0–34.0)
MCHC: 33.3 g/dL (ref 30.0–36.0)
MCV: 91.8 fL (ref 78.0–100.0)
Platelets: 267 10*3/uL (ref 150–400)
RBC: 4.51 MIL/uL (ref 3.87–5.11)
RDW: 13.6 % (ref 11.5–15.5)
WBC: 5.6 10*3/uL (ref 4.0–10.5)

## 2014-06-08 NOTE — Progress Notes (Signed)
On Feb 16th, she had appendectomy, when she woke up, she spit up blood.   "no big deal" per anesthesia.  Early March started to lose voice, went to ENT, they looked at her vocal cords, and she had blood blister on her cords. She was started on prednisone, and strict NO talking.   Its healed now, but CAUTION when intubating.

## 2014-06-12 ENCOUNTER — Ambulatory Visit (HOSPITAL_COMMUNITY): Payer: Medicare Other | Admitting: Anesthesiology

## 2014-06-12 ENCOUNTER — Encounter (HOSPITAL_COMMUNITY): Payer: Self-pay | Admitting: *Deleted

## 2014-06-12 ENCOUNTER — Encounter (HOSPITAL_COMMUNITY)
Admission: RE | Disposition: A | Discharge status: Home / Self Care | Payer: Self-pay | Source: Ambulatory Visit | Attending: Orthopedic Surgery

## 2014-06-12 ENCOUNTER — Ambulatory Visit (HOSPITAL_COMMUNITY)
Admission: RE | Admit: 2014-06-12 | Discharge: 2014-06-12 | Disposition: A | Discharge status: Home / Self Care | Payer: Medicare Other | Source: Ambulatory Visit | Attending: Orthopedic Surgery | Admitting: Orthopedic Surgery

## 2014-06-12 DIAGNOSIS — Z888 Allergy status to other drugs, medicaments and biological substances status: Secondary | ICD-10-CM | POA: Insufficient documentation

## 2014-06-12 DIAGNOSIS — M6752 Plica syndrome, left knee: Secondary | ICD-10-CM | POA: Diagnosis not present

## 2014-06-12 DIAGNOSIS — Z886 Allergy status to analgesic agent status: Secondary | ICD-10-CM | POA: Diagnosis not present

## 2014-06-12 DIAGNOSIS — D649 Anemia, unspecified: Secondary | ICD-10-CM | POA: Insufficient documentation

## 2014-06-12 DIAGNOSIS — M170 Bilateral primary osteoarthritis of knee: Secondary | ICD-10-CM | POA: Insufficient documentation

## 2014-06-12 HISTORY — PX: KNEE ARTHROSCOPY: SHX127

## 2014-06-12 LAB — TYPE AND SCREEN
ABO/RH(D): B POS
ANTIBODY SCREEN: NEGATIVE

## 2014-06-12 SURGERY — ARTHROSCOPY, KNEE
Anesthesia: General | Site: Knee | Laterality: Left

## 2014-06-12 MED ORDER — OXYCODONE-ACETAMINOPHEN 5-325 MG PO TABS: 1.0000 | ORAL_TABLET | Freq: Four times a day (QID) | ORAL | Status: DC | PRN

## 2014-06-12 MED ORDER — LACTATED RINGERS IV SOLN
INTRAVENOUS | Status: DC | PRN
  Administered 2014-06-12 (×2): via INTRAVENOUS

## 2014-06-12 MED ORDER — IBUPROFEN 600 MG PO TABS: 600.0000 mg | ORAL_TABLET | Freq: Four times a day (QID) | ORAL | Status: DC | PRN

## 2014-06-12 MED ORDER — PROPOFOL 10 MG/ML IV BOLUS
INTRAVENOUS | Status: AC
  Filled 2014-06-12: qty 20

## 2014-06-12 MED ORDER — MIDAZOLAM HCL 5 MG/5ML IJ SOLN
INTRAMUSCULAR | Status: DC | PRN
  Administered 2014-06-12: 1 mg via INTRAVENOUS

## 2014-06-12 MED ORDER — HYDROCODONE-ACETAMINOPHEN 7.5-325 MG PO TABS: 1.0000 | ORAL_TABLET | Freq: Once | ORAL | Status: DC | PRN

## 2014-06-12 MED ORDER — SCOPOLAMINE 1 MG/3DAYS TD PT72
1.0000 | MEDICATED_PATCH | TRANSDERMAL | Status: DC
  Administered 2014-06-12: 1.5 mg via TRANSDERMAL

## 2014-06-12 MED ORDER — CEFAZOLIN SODIUM-DEXTROSE 2-3 GM-% IV SOLR
INTRAVENOUS | Status: DC | PRN
  Administered 2014-06-12: 2 g via INTRAVENOUS

## 2014-06-12 MED ORDER — ONDANSETRON HCL 4 MG/2ML IJ SOLN
INTRAMUSCULAR | Status: DC | PRN
  Administered 2014-06-12: 4 mg via INTRAVENOUS

## 2014-06-12 MED ORDER — FENTANYL CITRATE (PF) 250 MCG/5ML IJ SOLN
INTRAMUSCULAR | Status: AC
Start: 2014-06-12 — End: 2014-06-12
  Filled 2014-06-12: qty 5

## 2014-06-12 MED ORDER — MIDAZOLAM HCL 2 MG/2ML IJ SOLN
INTRAMUSCULAR | Status: AC
  Filled 2014-06-12: qty 2

## 2014-06-12 MED ORDER — CHLORHEXIDINE GLUCONATE 4 % EX LIQD: 60.0000 mL | Freq: Once | CUTANEOUS | Status: DC

## 2014-06-12 MED ORDER — SCOPOLAMINE 1 MG/3DAYS TD PT72
MEDICATED_PATCH | TRANSDERMAL | Status: AC
  Filled 2014-06-12: qty 1

## 2014-06-12 MED ORDER — LACTATED RINGERS IV SOLN
INTRAVENOUS | Status: DC
  Administered 2014-06-12: 09:00:00 via INTRAVENOUS

## 2014-06-12 MED ORDER — PROMETHAZINE HCL 25 MG/ML IJ SOLN: 6.2500 mg | INTRAMUSCULAR | Status: DC | PRN

## 2014-06-12 MED ORDER — FENTANYL CITRATE (PF) 100 MCG/2ML IJ SOLN
INTRAMUSCULAR | Status: DC | PRN
  Administered 2014-06-12 (×2): 50 ug via INTRAVENOUS

## 2014-06-12 MED ORDER — BUPIVACAINE-EPINEPHRINE (PF) 0.5% -1:200000 IJ SOLN
INTRAMUSCULAR | Status: AC
Start: 2014-06-12 — End: 2014-06-12
  Filled 2014-06-12: qty 30

## 2014-06-12 MED ORDER — LIDOCAINE HCL (CARDIAC) 20 MG/ML IV SOLN
INTRAVENOUS | Status: AC
  Filled 2014-06-12: qty 5

## 2014-06-12 MED ORDER — SODIUM CHLORIDE 0.9 % IR SOLN
Status: DC | PRN
  Administered 2014-06-12 (×2): 3000 mL

## 2014-06-12 MED ORDER — PROPOFOL 10 MG/ML IV BOLUS
INTRAVENOUS | Status: DC | PRN
  Administered 2014-06-12: 150 mg via INTRAVENOUS

## 2014-06-12 MED ORDER — LIDOCAINE HCL (CARDIAC) 20 MG/ML IV SOLN
INTRAVENOUS | Status: DC | PRN
  Administered 2014-06-12: 40 mg via INTRAVENOUS

## 2014-06-12 MED ORDER — BUPIVACAINE-EPINEPHRINE 0.5% -1:200000 IJ SOLN
INTRAMUSCULAR | Status: DC | PRN
  Administered 2014-06-12: 30 mL

## 2014-06-12 MED ORDER — ARTIFICIAL TEARS OP OINT
TOPICAL_OINTMENT | OPHTHALMIC | Status: AC
  Filled 2014-06-12: qty 3.5

## 2014-06-12 MED ORDER — ONDANSETRON HCL 4 MG/2ML IJ SOLN
INTRAMUSCULAR | Status: AC
  Filled 2014-06-12: qty 2

## 2014-06-12 MED ORDER — HYDROMORPHONE HCL 1 MG/ML IJ SOLN: 0.2500 mg | INTRAMUSCULAR | Status: DC | PRN

## 2014-06-12 MED ORDER — MIDAZOLAM HCL 2 MG/2ML IJ SOLN
INTRAMUSCULAR | Status: AC
Start: 2014-06-12 — End: 2014-06-12
  Filled 2014-06-12: qty 2

## 2014-06-12 SURGICAL SUPPLY — 33 items
BANDAGE ELASTIC 6 VELCRO ST LF (GAUZE/BANDAGES/DRESSINGS) ×3 IMPLANT
BLADE GREAT WHITE 4.2 (BLADE) ×2 IMPLANT
BLADE GREAT WHITE 4.2MM (BLADE) ×1
BNDG COHESIVE 6X5 TAN STRL LF (GAUZE/BANDAGES/DRESSINGS) ×2 IMPLANT
DRAPE ARTHROSCOPY W/POUCH 114 (DRAPES) ×3 IMPLANT
DRAPE U-SHAPE 47X51 STRL (DRAPES) ×3 IMPLANT
ELECT REM PT RETURN 9FT ADLT (ELECTROSURGICAL)
ELECTRODE REM PT RTRN 9FT ADLT (ELECTROSURGICAL) IMPLANT
GAUZE SPONGE 4X4 12PLY STRL (GAUZE/BANDAGES/DRESSINGS) ×3 IMPLANT
GAUZE XEROFORM 1X8 LF (GAUZE/BANDAGES/DRESSINGS) ×3 IMPLANT
GLOVE BIOGEL PI IND STRL 6.5 (GLOVE) IMPLANT
GLOVE BIOGEL PI IND STRL 8.5 (GLOVE) ×2 IMPLANT
GLOVE BIOGEL PI INDICATOR 6.5 (GLOVE) ×2
GLOVE BIOGEL PI INDICATOR 8.5 (GLOVE) ×4
GLOVE SURG ORTHO 8.0 STRL STRW (GLOVE) ×6 IMPLANT
GLOVE SURG SS PI 6.5 STRL IVOR (GLOVE) ×2 IMPLANT
GOWN STRL REUS W/ TWL LRG LVL3 (GOWN DISPOSABLE) ×1 IMPLANT
GOWN STRL REUS W/TWL LRG LVL3 (GOWN DISPOSABLE) ×6
GOWN STRL REUS W/TWL XL LVL3 (GOWN DISPOSABLE) ×6 IMPLANT
KIT ROOM TURNOVER OR (KITS) ×3 IMPLANT
MANIFOLD NEPTUNE II (INSTRUMENTS) ×3 IMPLANT
PACK ARTHROSCOPY DSU (CUSTOM PROCEDURE TRAY) ×3 IMPLANT
PAD ARMBOARD 7.5X6 YLW CONV (MISCELLANEOUS) ×6 IMPLANT
PENCIL BUTTON HOLSTER BLD 10FT (ELECTRODE) IMPLANT
SET ARTHROSCOPY TUBING (MISCELLANEOUS) ×3
SET ARTHROSCOPY TUBING LN (MISCELLANEOUS) ×1 IMPLANT
SPONGE LAP 4X18 X RAY DECT (DISPOSABLE) ×3 IMPLANT
SUT ETHILON 4 0 PS 2 18 (SUTURE) ×3 IMPLANT
SYR 30ML LL (SYRINGE) ×6 IMPLANT
TOWEL OR 17X24 6PK STRL BLUE (TOWEL DISPOSABLE) ×3 IMPLANT
TOWEL OR 17X26 10 PK STRL BLUE (TOWEL DISPOSABLE) ×3 IMPLANT
WAND HAND CNTRL MULTIVAC 90 (MISCELLANEOUS) ×2 IMPLANT
WATER STERILE IRR 1000ML POUR (IV SOLUTION) ×3 IMPLANT

## 2014-06-12 NOTE — Anesthesia Procedure Notes (Signed)
Procedure Name: LMA Insertion Date/Time: 06/12/2014 10:49 AM Performed by: Scheryl Darter Pre-anesthesia Checklist: Patient identified, Emergency Drugs available, Suction available, Patient being monitored and Timeout performed Patient Re-evaluated:Patient Re-evaluated prior to inductionOxygen Delivery Method: Circle system utilized Preoxygenation: Pre-oxygenation with 100% oxygen Intubation Type: IV induction Ventilation: Mask ventilation without difficulty LMA: LMA inserted LMA Size: 4.0 Number of attempts: 1 Placement Confirmation: positive ETCO2 and breath sounds checked- equal and bilateral Tube secured with: Tape Dental Injury: Teeth and Oropharynx as per pre-operative assessment

## 2014-06-12 NOTE — Anesthesia Preprocedure Evaluation (Addendum)
Anesthesia Evaluation  Patient identified by MRN, date of birth, ID band Patient awake    Reviewed: Allergy & Precautions, H&P , NPO status , Patient's Chart, lab work & pertinent test results  History of Anesthesia Complications (+) PONV  Airway Mallampati: II  TM Distance: >3 FB Neck ROM: full    Dental no notable dental hx.    Pulmonary neg pulmonary ROS,  breath sounds clear to auscultation  Pulmonary exam normal       Cardiovascular negative cardio ROS  Rhythm:regular Rate:Normal     Neuro/Psych negative neurological ROS     GI/Hepatic negative GI ROS, Neg liver ROS,   Endo/Other  negative endocrine ROS  Renal/GU negative Renal ROS     Musculoskeletal  (+) Arthritis -,   Abdominal   Peds  Hematology negative hematology ROS (+)   Anesthesia Other Findings   Reproductive/Obstetrics                             Anesthesia Physical  Anesthesia Plan  ASA: II  Anesthesia Plan: General   Post-op Pain Management:    Induction: Intravenous  Airway Management Planned: LMA  Additional Equipment:   Intra-op Plan:   Post-operative Plan: Extubation in OR  Informed Consent: I have reviewed the patients History and Physical, chart, labs and discussed the procedure including the risks, benefits and alternatives for the proposed anesthesia with the patient or authorized representative who has indicated his/her understanding and acceptance.   Dental Advisory Given and Dental advisory given  Plan Discussed with: CRNA and Surgeon  Anesthesia Plan Comments: (  )        Anesthesia Quick Evaluation

## 2014-06-12 NOTE — H&P (Signed)
  Veronica Phelps MRN:  [id number] DOB/SEX:  1945-06-08/female  CHIEF COMPLAINT:  Painful left Knee  HISTORY: Patient is a 69 y.o. female presented with a history of pain in the left knee. Onset of symptoms was abrupt starting several months ago with rapidly worsening course since that time. Prior procedures on the knee include arthroplasty. Patient has been treated conservatively with over-the-counter NSAIDs and activity modification. Patient currently rates pain in the knee at 8 out of 10 with activity. There is pain at night.  PAST MEDICAL HISTORY: Patient Active Problem List   Diagnosis Date Noted  . Dermoid cyst of right ovary 03/14/2014  . Mass of uterus 03/14/2014  . S/P appy 03/14/2014  . Appendicitis 03/13/2014  . Acute appendicitis 03/13/2014  . S/P total knee arthroplasty 03/14/2013   Past Medical History  Diagnosis Date  . SVD (spontaneous vaginal delivery)     x 4  . Arthritis     knees - no meds  . Bilateral ovarian cysts   . Anemia   . Complication of anesthesia     takes awhile to wake up.  Also see my note about her vocal cords  . PONV (postoperative nausea and vomiting)    Past Surgical History  Procedure Laterality Date  . Hernia repair      1970s x2 -81 y/o  . Laparoscopy      1993 - Uterus mass  . Knee arthroscopy w/ meniscal repair      left    11/2011  . Total knee arthroplasty Left 03/14/2013    Procedure: LEFT TOTAL KNEE ARTHROPLASTY;  Surgeon: Vickey Huger, MD;  Location: [facility];  Service: Orthopedics;  Laterality: Left;  . Laparoscopic appendectomy N/A 03/13/2014    Procedure: APPENDECTOMY LAPAROSCOPIC;  Surgeon: Jackolyn Confer, MD;  Location: [facility];  Service: General;  Laterality: N/A;  . Wisdom tooth extraction    . Laparoscopic bilateral salpingo oopherectomy Bilateral 05/24/2014    Procedure: LAPAROSCOPIC BILATERAL SALPINGO OOPHORECTOMY;  Surgeon: Janyth Pupa, DO;  Location: [facility];  Service: Gynecology;  Laterality: Bilateral;  . Appendectomy        MEDICATIONS:   No prescriptions prior to admission    ALLERGIES:   Allergies  Allergen Reactions  . Oxycontin [Oxycodone Hcl] Other (See Comments)    Dizziness  . Ultram [Tramadol] Other (See Comments)    Dizziness, and headaches    REVIEW OF SYSTEMS:  A comprehensive review of systems was negative.   FAMILY HISTORY:  No family history on file.  SOCIAL HISTORY:   History  Substance Use Topics  . Smoking status: Never Smoker   . Smokeless tobacco: Never Used  . Alcohol Use: No     EXAMINATION:  Vital signs in last 24 hours:    General appearance: alert, cooperative and no distress Lungs: clear to auscultation bilaterally Heart: regular rate and rhythm, S1, S2 normal, no murmur, click, rub or gallop Abdomen: soft, non-tender; bowel sounds normal; no masses,  no organomegaly Extremities: extremities normal, atraumatic, no cyanosis or edema and Homans sign is negative, no sign of DVT Pulses: 2+ and symmetric Skin: Skin color, texture, turgor normal. No rashes or lesions  Musculoskeletal:  ROM 0-115, Ligaments intact,  Imaging Review Plain radiographs demonstrate components in alignment  of the left knee. The overall alignment is neutral. The bone quality appears to be good for age and reported activity level.  Assessment/Plan:  left knee pain  Left knee scope, plicectomy, possible revision  Shreshta Medley 06/12/2014, 7:01 AM

## 2014-06-12 NOTE — Anesthesia Postprocedure Evaluation (Signed)
  Anesthesia Post-op Note  Patient: Veronica Phelps  Procedure(s) Performed: Procedure(s): LEFT KNEE ARTHROSCOPY  (Left)  Patient Location: PACU  Anesthesia Type:General  Level of Consciousness: awake and alert   Airway and Oxygen Therapy: Patient Spontanous Breathing  Post-op Pain: mild  Post-op Assessment: Post-op Vital signs reviewed  Post-op Vital Signs: Reviewed  Last Vitals:  Filed Vitals:   06/12/14 1315  BP: 150/60  Pulse: 59  Temp: 36.4 C  Resp:     Complications: No apparent anesthesia complications

## 2014-06-12 NOTE — Transfer of Care (Signed)
Immediate Anesthesia Transfer of Care Note  Patient: Veronica Phelps  Procedure(s) Performed: Procedure(s): LEFT KNEE ARTHROSCOPY  (Left)  Patient Location: PACU  Anesthesia Type:General  Level of Consciousness: awake, alert , oriented and sedated  Airway & Oxygen Therapy: Patient Spontanous Breathing and Patient connected to nasal cannula oxygen  Post-op Assessment: Report given to RN, Post -op Vital signs reviewed and stable and Patient moving all extremities  Post vital signs: Reviewed and stable  Last Vitals:  Filed Vitals:   06/12/14 0841  BP: 143/58  Pulse: 63  Temp: 36.5 C  Resp: 20    Complications: No apparent anesthesia complications

## 2014-06-12 NOTE — Discharge Instructions (Signed)
Diet: As you were doing prior to hospitalization   Activity:  Increase activity slowly as tolerated                  No lifting or driving for 6 weeks  Shower:  May shower without a dressing once there is no drainage from your wound.                 Do NOT wash over the wound.                 Dressing:  You may change your dressing on Wednesday                    Then change the dressing daily with sterile 4"x4"s gauze dressing                     And TED hose for knees.  Weight Bearing:  Weight bearing as tolerated as taught in physical therapy.  Use a                                walker or Crutches as instructed.  To prevent constipation: you may use a stool softener such as -               Colace ( over the counter) 100 mg by mouth twice a day                Drink plenty of fluids ( prune juice may be helpful) and high fiber foods                Miralax ( over the counter) for constipation as needed.    Precautions:  If you experience chest pain or shortness of breath - call 911 immediately               For transfer to the hospital emergency department!!               If you develop a fever greater that 101 F, purulent drainage from wound,                             increased redness or drainage from wound, or calf pain -- Call the office.  Follow- Up Appointment:  Please call for an appointment                                               [facility] office:  [phone]            [address] [place], [place] [address]

## 2014-06-14 ENCOUNTER — Encounter (HOSPITAL_COMMUNITY): Payer: Self-pay | Admitting: Orthopedic Surgery

## 2014-06-14 NOTE — Op Note (Signed)
NAMESRAVYA, GRISSOM NO.:  [id number]  MEDICAL RECORD NO.:  [id number]  LOCATION:  [facility]                         FACILITY:  [facility]  PHYSICIAN:  Estill Bamberg. Ronnie Derby, M.D. DATE OF BIRTH:  1945-04-17  DATE OF PROCEDURE:  06/12/2014 DATE OF DISCHARGE:  06/12/2014                              OPERATIVE REPORT   SURGEON:  Estill Bamberg. Ronnie Derby, M.D.  ASSISTANT:  Carlynn Spry, PA-C  ANESTHESIA:  General.  PREOPERATIVE DIAGNOSIS:  Left knee scar tissue plica syndrome.  POSTOPERATIVE DIAGNOSIS:  Left knee scar tissue plica syndrome.  PROCEDURE:  Left knee arthroscopy with plicectomy.  INDICATION FOR PROCEDURE:  The patient is a 69 year old, who is a year and a half out from a total knee replacement.  She had an injury a couple of months ago and since that time, has had clicking when she goes from extension to flexion in the patellofemoral joint.  Informed consent was obtained.  DESCRIPTION OF PROCEDURE:  The patient was laid supine and administered general anesthesia.  The left leg was prepped and draped in usual fashion.  Inferolateral and inferomedial portals were created with a #11 blade, blunt trocar, and cannula.  Diagnostic arthroscopy revealed impinging scar tissue medially into the patellofemoral joint.  This was debrided through the medial portal with a great White shaver.  I then used ArthroCare debridement wand to obtain hemostasis, further debrided back to a stable rim.  I then went into the medial and lateral gutters as well as posteriorly the components of brace.  There was no other scar tissue.  I then lavaged and closed with 4-0 nylon sutures.  Dressed with Xeroform, dressing sponges, sterile Webril, and Ace wrap.  COMPLICATIONS:  None.  DRAINS:  None.          ______________________________ Estill Bamberg. Ronnie Derby, M.D.     SDL/MEDQ  D:  06/14/2014  T:  06/14/2014  Job:  [id number]

## 2014-06-14 NOTE — Op Note (Signed)
Dictation Number:  [id number]

## 2014-06-23 NOTE — Addendum Note (Signed)
Addendum  created 06/23/14 1528 by Suzette Battiest, MD   Modules edited: Anesthesia Attestations

## 2016-01-28 HISTORY — PX: BLEPHAROPLASTY: SUR158

## 2016-01-28 HISTORY — PX: CATARACT EXTRACTION W/ INTRAOCULAR LENS IMPLANT: SHX1309

## 2016-06-12 ENCOUNTER — Other Ambulatory Visit: Payer: Self-pay | Admitting: Orthopedic Surgery

## 2016-07-31 NOTE — Pre-Procedure Instructions (Signed)
[place]  07/31/2016      [facility] [address] [place] - [address] [place] [place] [address] Phone: [phone] Fax: [phone]    Your procedure is scheduled on Monday, 08/11/2016.  Report to [facility] Admitting at [place].M.  Call this number if you have problems the morning of surgery:  [phone]   Remember:  Do not eat food or drink liquids after midnight.  Take these medicines the morning of surgery with A SIP OF WATER Tylenol - if needed   Do not wear jewelry, make-up or nail polish.  Do not wear lotions, powders, or perfumes, or deodorant.  Do not shave 48 hours prior to surgery.  Do not bring valuables to the hospital.  [facility] is not responsible for any belongings or valuables.  Contacts, dentures or bridgework may not be worn into surgery.  Leave your suitcase in the car.  After surgery it may be brought to your room.  For patients admitted to the hospital, discharge time will be determined by your treatment team.  Patients discharged the day of surgery will not be allowed to drive home.   Name and phone number of your driver:    Special instructions:   [facility]- Preparing For Surgery  Before surgery, you can play an important role. Because skin is not sterile, your skin needs to be as free of germs as possible. You can reduce the number of germs on your skin by washing with CHG (chlorahexidine gluconate) Soap before surgery.  CHG is an antiseptic cleaner which kills germs and bonds with the skin to continue killing germs even after washing.  Please do not use if you have an allergy to CHG or antibacterial soaps. If your skin becomes reddened/irritated stop using the CHG.  Do not shave (including legs and underarms) for at least 48 hours prior to first CHG shower. It is OK to shave your face.  Please follow these instructions carefully.   1. Shower the NIGHT BEFORE SURGERY and the MORNING  OF SURGERY with CHG.   2. If you chose to wash your hair, wash your hair first as usual with your normal shampoo.  3. After you shampoo, rinse your hair and body thoroughly to remove the shampoo.  4. Use CHG as you would any other liquid soap. You can apply CHG directly to the skin and wash gently with a scrungie or a clean washcloth.   5. Apply the CHG Soap to your body ONLY FROM THE NECK DOWN.  Do not use on open wounds or open sores. Avoid contact with your eyes, ears, mouth and genitals (private parts). Wash genitals (private parts) with your normal soap.  6. Wash thoroughly, paying special attention to the area where your surgery will be performed.  7. Thoroughly rinse your body with warm water from the neck down.  8. DO NOT shower/wash with your normal soap after using and rinsing off the CHG Soap.  9. Pat yourself dry with a CLEAN TOWEL.   10. Wear CLEAN PAJAMAS   11. Place CLEAN SHEETS on your bed the night of your first shower and DO NOT SLEEP WITH PETS.    Day of Surgery: Do not apply any deodorants/lotions. Please wear clean clothes to the hospital/surgery center.      Please read over the following fact sheets that you were given. Pain Booklet, Total Joint Packet, MRSA Information and Surgical Site Infection Prevention

## 2016-08-01 ENCOUNTER — Encounter (HOSPITAL_COMMUNITY): Payer: Self-pay

## 2016-08-01 ENCOUNTER — Encounter (HOSPITAL_COMMUNITY)
Admission: RE | Admit: 2016-08-01 | Discharge: 2016-08-01 | Disposition: A | Discharge status: Home / Self Care | Payer: Medicare Other | Source: Ambulatory Visit | Attending: Orthopedic Surgery | Admitting: Orthopedic Surgery

## 2016-08-01 DIAGNOSIS — Z01812 Encounter for preprocedural laboratory examination: Secondary | ICD-10-CM | POA: Insufficient documentation

## 2016-08-01 LAB — COMPREHENSIVE METABOLIC PANEL
ALK PHOS: 78 U/L (ref 38–126)
ALT: 23 U/L (ref 14–54)
AST: 23 U/L (ref 15–41)
Albumin: 4 g/dL (ref 3.5–5.0)
Anion gap: 8 (ref 5–15)
BUN: 17 mg/dL (ref 6–20)
CO2: 25 mmol/L (ref 22–32)
CREATININE: 0.8 mg/dL (ref 0.44–1.00)
Calcium: 9.5 mg/dL (ref 8.9–10.3)
Chloride: 106 mmol/L (ref 101–111)
GFR calc Af Amer: >60 mL/min (ref >60)
Glucose, Bld: 122 mg/dL — ABNORMAL HIGH (ref 65–99)
Potassium: 4 mmol/L (ref 3.5–5.1)
SODIUM: 139 mmol/L (ref 135–145)
Total Bilirubin: 0.5 mg/dL (ref 0.3–1.2)
Total Protein: 6.5 g/dL (ref 6.5–8.1)

## 2016-08-01 LAB — CBC WITH DIFFERENTIAL/PLATELET
Basophils Absolute: 0 10*3/uL (ref 0.0–0.1)
Basophils Relative: 0 %
Eosinophils Absolute: 0.2 10*3/uL (ref 0.0–0.7)
Eosinophils Relative: 3 %
HCT: 43.5 % (ref 36.0–46.0)
HEMOGLOBIN: 14.5 g/dL (ref 12.0–15.0)
LYMPHS ABS: 1.5 10*3/uL (ref 0.7–4.0)
Lymphocytes Relative: 24 %
MCH: 31.9 pg (ref 26.0–34.0)
MCHC: 33.3 g/dL (ref 30.0–36.0)
MCV: 95.8 fL (ref 78.0–100.0)
Monocytes Absolute: 0.3 10*3/uL (ref 0.1–1.0)
Monocytes Relative: 4 %
NEUTROS PCT: 69 %
Neutro Abs: 4.2 10*3/uL (ref 1.7–7.7)
Platelets: 249 10*3/uL (ref 150–400)
RBC: 4.54 MIL/uL (ref 3.87–5.11)
RDW: 13 % (ref 11.5–15.5)
WBC: 6.1 10*3/uL (ref 4.0–10.5)

## 2016-08-01 LAB — SURGICAL PCR SCREEN
MRSA, PCR: NEGATIVE
Staphylococcus aureus: NEGATIVE

## 2016-08-01 NOTE — Progress Notes (Signed)
PCP - Lawerance Cruel Cardiologist - denies  Chest x-ray - denies EKG - not needed Stress Test - debues ECHO - denies Cardiac Cath - denies     Patient denies shortness of breath, fever, cough and chest pain at PAT appointment   Patient verbalized understanding of instructions that were given to them at the PAT appointment. Patient was also instructed that they will need to review over the PAT instructions again at home before surgery.

## 2016-08-08 MED ORDER — ACETAMINOPHEN 500 MG PO TABS
1000.0000 mg | ORAL_TABLET | Freq: Once | ORAL | Status: AC
  Administered 2016-08-11: 1000 mg via ORAL
  Filled 2016-08-08: qty 2

## 2016-08-08 MED ORDER — TRANEXAMIC ACID 1000 MG/10ML IV SOLN
1000.0000 mg | INTRAVENOUS | Status: AC
  Administered 2016-08-11: 1000 mg via INTRAVENOUS
  Filled 2016-08-08: qty 10

## 2016-08-08 MED ORDER — BUPIVACAINE LIPOSOME 1.3 % IJ SUSP
20.0000 mL | INTRAMUSCULAR | Status: AC
  Administered 2016-08-11: 20 mL
  Filled 2016-08-08: qty 20

## 2016-08-08 MED ORDER — DEXAMETHASONE SODIUM PHOSPHATE 10 MG/ML IJ SOLN
8.0000 mg | Freq: Once | INTRAMUSCULAR | Status: DC
  Filled 2016-08-08: qty 1

## 2016-08-08 MED ORDER — CEFAZOLIN SODIUM-DEXTROSE 2-4 GM/100ML-% IV SOLN
2.0000 g | INTRAVENOUS | Status: AC
  Administered 2016-08-11: 2 g via INTRAVENOUS
  Filled 2016-08-08: qty 100

## 2016-08-08 MED ORDER — GABAPENTIN 300 MG PO CAPS
300.0000 mg | ORAL_CAPSULE | Freq: Once | ORAL | Status: AC
  Administered 2016-08-11: 300 mg via ORAL
  Filled 2016-08-08: qty 1

## 2016-08-10 NOTE — H&P (Signed)
Veronica Phelps MRN:  [id number] DOB/SEX:  08/11/1945/female  CHIEF COMPLAINT:  Painful right Knee  HISTORY: Patient is a 71 y.o. female presented with a history of pain in the right knee. Onset of symptoms was gradual starting a few years ago with gradually worsening course since that time. Patient has been treated conservatively with over-the-counter NSAIDs and activity modification. Patient currently rates pain in the knee at 10 out of 10 with activity. There is pain at night.  PAST MEDICAL HISTORY: Patient Active Problem List   Diagnosis Date Noted  . Dermoid cyst of right ovary 03/14/2014  . Mass of uterus 03/14/2014  . S/P appy 03/14/2014  . Appendicitis 03/13/2014  . Acute appendicitis 03/13/2014  . S/P total knee arthroplasty 03/14/2013   Past Medical History:  Diagnosis Date  . Anemia   . Arthritis    knees - no meds  . Bilateral ovarian cysts   . Complication of anesthesia    takes awhile to wake up.  Also see my note about her vocal cords  . PONV (postoperative nausea and vomiting)   . SVD (spontaneous vaginal delivery)    x 4   Past Surgical History:  Procedure Laterality Date  . APPENDECTOMY    . HERNIA REPAIR     1970s x2 -43 y/o  . KNEE ARTHROSCOPY Left 06/12/2014   Procedure: LEFT KNEE ARTHROSCOPY ;  Surgeon: Vickey Huger, MD;  Location: [place];  Service: Orthopedics;  Laterality: Left;  . KNEE ARTHROSCOPY W/ MENISCAL REPAIR     left    11/2011  . LAPAROSCOPIC APPENDECTOMY N/A 03/13/2014   Procedure: APPENDECTOMY LAPAROSCOPIC;  Surgeon: Jackolyn Confer, MD;  Location: [facility];  Service: General;  Laterality: N/A;  . LAPAROSCOPIC BILATERAL SALPINGO OOPHERECTOMY Bilateral 05/24/2014   Procedure: LAPAROSCOPIC BILATERAL SALPINGO OOPHORECTOMY;  Surgeon: Janyth Pupa, DO;  Location: [facility];  Service: Gynecology;  Laterality: Bilateral;  . LAPAROSCOPY     1993 - Uterus mass  . TOTAL KNEE ARTHROPLASTY Left 03/14/2013   Procedure: LEFT TOTAL KNEE ARTHROPLASTY;  Surgeon:  Vickey Huger, MD;  Location: [place];  Service: Orthopedics;  Laterality: Left;  . WISDOM TOOTH EXTRACTION       MEDICATIONS:   No prescriptions prior to admission.    ALLERGIES:   Allergies  Allergen Reactions  . Propoxyphene Other (See Comments)    fainting  . Ultram [Tramadol] Other (See Comments)    Dizziness, and headaches  . Oxycontin [Oxycodone Hcl] Other (See Comments)    Dizziness    REVIEW OF SYSTEMS:  A comprehensive review of systems was negative except for: Musculoskeletal: positive for arthralgias and bone pain   FAMILY HISTORY:  No family history on file.  SOCIAL HISTORY:   Social History  Substance Use Topics  . Smoking status: Never Smoker  . Smokeless tobacco: Never Used  . Alcohol use No     EXAMINATION:  Vital signs in last 24 hours:    There were no vitals taken for this visit.  General Appearance:    Alert, cooperative, no distress, appears stated age  Head:    Normocephalic, without obvious abnormality, atraumatic  Eyes:    PERRL, conjunctiva/corneas clear, EOM's intact, fundi    benign, both eyes  Ears:    Normal TM's and external ear canals, both ears  Nose:   Nares normal, septum midline, mucosa normal, no drainage    or sinus tenderness  Throat:   Lips, mucosa, and tongue normal; teeth and gums normal  Neck:  Supple, symmetrical, trachea midline, no adenopathy;    thyroid:  no enlargement/tenderness/nodules; no carotid   bruit or JVD  Back:     Symmetric, no curvature, ROM normal, no CVA tenderness  Lungs:     Clear to auscultation bilaterally, respirations unlabored  Chest Wall:    No tenderness or deformity   Heart:    Regular rate and rhythm, S1 and S2 normal, no murmur, rub   or gallop  Breast Exam:    No tenderness, masses, or nipple abnormality  Abdomen:     Soft, non-tender, bowel sounds active all four quadrants,    no masses, no organomegaly  Genitalia:    Normal female without lesion, discharge or tenderness  Rectal:     Normal tone, no masses or tenderness;   guaiac negative stool  Extremities:   Extremities normal, atraumatic, no cyanosis or edema  Pulses:   2+ and symmetric all extremities  Skin:   Skin color, texture, turgor normal, no rashes or lesions  Lymph nodes:   Cervical, supraclavicular, and axillary nodes normal  Neurologic:   CNII-XII intact, normal strength, sensation and reflexes    throughout    Musculoskeletal:  ROM 0-120, Ligaments intact,  Imaging Review Plain radiographs demonstrate severe degenerative joint disease of the right knee. The overall alignment is neutral. The bone quality appears to be good for age and reported activity level.  Assessment/Plan: Primary osteoarthritis, right knee   The patient history, physical examination and imaging studies are consistent with advanced degenerative joint disease of the right knee. The patient has failed conservative treatment.  The clearance notes were reviewed.  After discussion with the patient it was felt that Total Knee Replacement was indicated. The procedure,  risks, and benefits of total knee arthroplasty were presented and reviewed. The risks including but not limited to aseptic loosening, infection, blood clots, vascular injury, stiffness, patella tracking problems complications among others were discussed. The patient acknowledged the explanation, agreed to proceed with the plan.  Donia Ast 08/10/2016, 8:34 PM

## 2016-08-11 ENCOUNTER — Ambulatory Visit (HOSPITAL_COMMUNITY): Payer: Medicare Other | Admitting: Certified Registered"

## 2016-08-11 ENCOUNTER — Observation Stay (HOSPITAL_COMMUNITY)
Admission: RE | Admit: 2016-08-11 | Discharge: 2016-08-12 | Disposition: A | Discharge status: Home / Self Care | Payer: Medicare Other | Source: Ambulatory Visit | Attending: Orthopedic Surgery | Admitting: Orthopedic Surgery

## 2016-08-11 ENCOUNTER — Encounter (HOSPITAL_COMMUNITY)
Admission: RE | Disposition: A | Discharge status: Home / Self Care | Payer: Self-pay | Source: Ambulatory Visit | Attending: Orthopedic Surgery

## 2016-08-11 ENCOUNTER — Encounter (HOSPITAL_COMMUNITY): Payer: Self-pay

## 2016-08-11 DIAGNOSIS — Z96659 Presence of unspecified artificial knee joint: Secondary | ICD-10-CM

## 2016-08-11 DIAGNOSIS — Z96652 Presence of left artificial knee joint: Secondary | ICD-10-CM | POA: Diagnosis not present

## 2016-08-11 DIAGNOSIS — M1711 Unilateral primary osteoarthritis, right knee: Secondary | ICD-10-CM | POA: Diagnosis not present

## 2016-08-11 HISTORY — PX: TOTAL KNEE ARTHROPLASTY: SHX125

## 2016-08-11 SURGERY — ARTHROPLASTY, KNEE, TOTAL
Anesthesia: Regional | Laterality: Right

## 2016-08-11 MED ORDER — SCOPOLAMINE 1 MG/3DAYS TD PT72
MEDICATED_PATCH | TRANSDERMAL | Status: AC
  Filled 2016-08-11: qty 1

## 2016-08-11 MED ORDER — ACETAMINOPHEN 650 MG RE SUPP: 650.0000 mg | Freq: Four times a day (QID) | RECTAL | Status: DC | PRN

## 2016-08-11 MED ORDER — PROPOFOL 500 MG/50ML IV EMUL
INTRAVENOUS | Status: DC | PRN
  Administered 2016-08-11: 75 ug/kg/min via INTRAVENOUS

## 2016-08-11 MED ORDER — BISACODYL 5 MG PO TBEC: 5.0000 mg | DELAYED_RELEASE_TABLET | Freq: Every day | ORAL | Status: DC | PRN

## 2016-08-11 MED ORDER — EPINEPHRINE PF 1 MG/ML IJ SOLN
INTRAMUSCULAR | Status: AC
  Filled 2016-08-11: qty 1

## 2016-08-11 MED ORDER — PHENYLEPHRINE 40 MCG/ML (10ML) SYRINGE FOR IV PUSH (FOR BLOOD PRESSURE SUPPORT)
PREFILLED_SYRINGE | INTRAVENOUS | Status: AC
  Filled 2016-08-11: qty 10

## 2016-08-11 MED ORDER — MIDAZOLAM HCL 5 MG/5ML IJ SOLN
INTRAMUSCULAR | Status: DC | PRN
  Administered 2016-08-11 (×2): 1 mg via INTRAVENOUS

## 2016-08-11 MED ORDER — CHLORHEXIDINE GLUCONATE 4 % EX LIQD: 60.0000 mL | Freq: Once | CUTANEOUS | Status: DC

## 2016-08-11 MED ORDER — SENNOSIDES-DOCUSATE SODIUM 8.6-50 MG PO TABS: 1.0000 | ORAL_TABLET | Freq: Every evening | ORAL | Status: DC | PRN

## 2016-08-11 MED ORDER — TRANEXAMIC ACID 1000 MG/10ML IV SOLN
1000.0000 mg | Freq: Once | INTRAVENOUS | Status: AC
  Administered 2016-08-11: 1000 mg via INTRAVENOUS
  Filled 2016-08-11: qty 10

## 2016-08-11 MED ORDER — FLEET ENEMA 7-19 GM/118ML RE ENEM: 1.0000 | ENEMA | Freq: Once | RECTAL | Status: DC | PRN

## 2016-08-11 MED ORDER — PHENYLEPHRINE HCL 10 MG/ML IJ SOLN
INTRAVENOUS | Status: DC | PRN
  Administered 2016-08-11: 25 ug/min via INTRAVENOUS

## 2016-08-11 MED ORDER — BUPIVACAINE-EPINEPHRINE (PF) 0.25% -1:200000 IJ SOLN
INTRAMUSCULAR | Status: DC | PRN
  Administered 2016-08-11: 30 mL

## 2016-08-11 MED ORDER — GABAPENTIN 300 MG PO CAPS
300.0000 mg | ORAL_CAPSULE | Freq: Three times a day (TID) | ORAL | Status: DC
  Administered 2016-08-11 – 2016-08-12 (×4): 300 mg via ORAL
  Filled 2016-08-11 (×4): qty 1

## 2016-08-11 MED ORDER — ONDANSETRON HCL 4 MG/2ML IJ SOLN
INTRAMUSCULAR | Status: DC | PRN
  Administered 2016-08-11: 4 mg via INTRAVENOUS

## 2016-08-11 MED ORDER — ONDANSETRON HCL 4 MG PO TABS: 4.0000 mg | ORAL_TABLET | Freq: Four times a day (QID) | ORAL | Status: DC | PRN

## 2016-08-11 MED ORDER — ONDANSETRON HCL 4 MG/2ML IJ SOLN: 4.0000 mg | Freq: Once | INTRAMUSCULAR | Status: DC | PRN

## 2016-08-11 MED ORDER — ACETAMINOPHEN 325 MG PO TABS: 650.0000 mg | ORAL_TABLET | Freq: Four times a day (QID) | ORAL | Status: DC | PRN

## 2016-08-11 MED ORDER — MENTHOL 3 MG MT LOZG: 1.0000 | LOZENGE | OROMUCOSAL | Status: DC | PRN

## 2016-08-11 MED ORDER — 0.9 % SODIUM CHLORIDE (POUR BTL) OPTIME
TOPICAL | Status: DC | PRN
  Administered 2016-08-11: 1000 mL

## 2016-08-11 MED ORDER — PHENOL 1.4 % MT LIQD: 1.0000 | OROMUCOSAL | Status: DC | PRN

## 2016-08-11 MED ORDER — MIDAZOLAM HCL 2 MG/2ML IJ SOLN
INTRAMUSCULAR | Status: AC
  Filled 2016-08-11: qty 2

## 2016-08-11 MED ORDER — METOCLOPRAMIDE HCL 5 MG PO TABS: 5.0000 mg | ORAL_TABLET | Freq: Three times a day (TID) | ORAL | Status: DC | PRN

## 2016-08-11 MED ORDER — DIPHENHYDRAMINE HCL 12.5 MG/5ML PO ELIX
12.5000 mg | ORAL_SOLUTION | ORAL | Status: DC | PRN
Start: 2016-08-11 — End: 2016-08-12

## 2016-08-11 MED ORDER — HYDROCODONE-ACETAMINOPHEN 7.5-325 MG PO TABS
1.0000 | ORAL_TABLET | Freq: Four times a day (QID) | ORAL | Status: DC
  Administered 2016-08-11 – 2016-08-12 (×5): 1 via ORAL
  Filled 2016-08-11 (×5): qty 1

## 2016-08-11 MED ORDER — SCOPOLAMINE 1 MG/3DAYS TD PT72
MEDICATED_PATCH | TRANSDERMAL | Status: DC | PRN
  Administered 2016-08-11: 1 via TRANSDERMAL

## 2016-08-11 MED ORDER — BUPIVACAINE HCL (PF) 0.25 % IJ SOLN
INTRAMUSCULAR | Status: AC
  Filled 2016-08-11: qty 30

## 2016-08-11 MED ORDER — METOCLOPRAMIDE HCL 5 MG/ML IJ SOLN: 5.0000 mg | Freq: Three times a day (TID) | INTRAMUSCULAR | Status: DC | PRN

## 2016-08-11 MED ORDER — LACTATED RINGERS IV SOLN
INTRAVENOUS | Status: DC | PRN
  Administered 2016-08-11 (×2): via INTRAVENOUS

## 2016-08-11 MED ORDER — ZOLPIDEM TARTRATE 5 MG PO TABS: 5.0000 mg | ORAL_TABLET | Freq: Every evening | ORAL | Status: DC | PRN

## 2016-08-11 MED ORDER — FENTANYL CITRATE (PF) 250 MCG/5ML IJ SOLN
INTRAMUSCULAR | Status: AC
  Filled 2016-08-11: qty 5

## 2016-08-11 MED ORDER — DOCUSATE SODIUM 100 MG PO CAPS
100.0000 mg | ORAL_CAPSULE | Freq: Two times a day (BID) | ORAL | Status: DC
  Administered 2016-08-12: 100 mg via ORAL
  Filled 2016-08-11 (×3): qty 1

## 2016-08-11 MED ORDER — FENTANYL CITRATE (PF) 100 MCG/2ML IJ SOLN: 25.0000 ug | INTRAMUSCULAR | Status: DC | PRN

## 2016-08-11 MED ORDER — BUPIVACAINE IN DEXTROSE 0.75-8.25 % IT SOLN
INTRATHECAL | Status: DC | PRN
  Administered 2016-08-11: 1.6 mL via INTRATHECAL

## 2016-08-11 MED ORDER — ASPIRIN EC 325 MG PO TBEC
325.0000 mg | DELAYED_RELEASE_TABLET | Freq: Two times a day (BID) | ORAL | Status: DC
  Administered 2016-08-11 – 2016-08-12 (×3): 325 mg via ORAL
  Filled 2016-08-11 (×3): qty 1

## 2016-08-11 MED ORDER — ONDANSETRON HCL 4 MG/2ML IJ SOLN: 4.0000 mg | Freq: Four times a day (QID) | INTRAMUSCULAR | Status: DC | PRN

## 2016-08-11 MED ORDER — SODIUM CHLORIDE 0.9 % IR SOLN
Status: DC | PRN
  Administered 2016-08-11: 3000 mL

## 2016-08-11 MED ORDER — CELECOXIB 200 MG PO CAPS
200.0000 mg | ORAL_CAPSULE | Freq: Two times a day (BID) | ORAL | Status: DC
  Administered 2016-08-11 – 2016-08-12 (×3): 200 mg via ORAL
  Filled 2016-08-11 (×3): qty 1

## 2016-08-11 MED ORDER — HYDROMORPHONE HCL 1 MG/ML IJ SOLN: 1.0000 mg | INTRAMUSCULAR | Status: DC | PRN

## 2016-08-11 MED ORDER — METHOCARBAMOL 1000 MG/10ML IJ SOLN
500.0000 mg | Freq: Four times a day (QID) | INTRAVENOUS | Status: DC | PRN
  Filled 2016-08-11: qty 5

## 2016-08-11 MED ORDER — SODIUM CHLORIDE 0.9 % IJ SOLN
INTRAMUSCULAR | Status: DC | PRN
  Administered 2016-08-11: 20 mL

## 2016-08-11 MED ORDER — ROPIVACAINE HCL 5 MG/ML IJ SOLN
INTRAMUSCULAR | Status: DC | PRN
  Administered 2016-08-11: 30 mL via PERINEURAL

## 2016-08-11 MED ORDER — ALUM & MAG HYDROXIDE-SIMETH 200-200-20 MG/5ML PO SUSP: 30.0000 mL | ORAL | Status: DC | PRN

## 2016-08-11 MED ORDER — CEFAZOLIN SODIUM-DEXTROSE 1-4 GM/50ML-% IV SOLN
1.0000 g | Freq: Four times a day (QID) | INTRAVENOUS | Status: AC
  Administered 2016-08-11 (×2): 1 g via INTRAVENOUS
  Filled 2016-08-11 (×2): qty 50

## 2016-08-11 MED ORDER — DEXAMETHASONE SODIUM PHOSPHATE 10 MG/ML IJ SOLN
10.0000 mg | Freq: Once | INTRAMUSCULAR | Status: AC
  Administered 2016-08-12: 10 mg via INTRAVENOUS
  Filled 2016-08-11: qty 1

## 2016-08-11 MED ORDER — ONDANSETRON HCL 4 MG/2ML IJ SOLN
INTRAMUSCULAR | Status: AC
  Filled 2016-08-11: qty 2

## 2016-08-11 MED ORDER — DEXAMETHASONE SODIUM PHOSPHATE 10 MG/ML IJ SOLN
INTRAMUSCULAR | Status: DC | PRN
  Administered 2016-08-11: 10 mg via INTRAVENOUS

## 2016-08-11 MED ORDER — PROPOFOL 10 MG/ML IV BOLUS
INTRAVENOUS | Status: AC
  Filled 2016-08-11: qty 20

## 2016-08-11 MED ORDER — PHENYLEPHRINE 40 MCG/ML (10ML) SYRINGE FOR IV PUSH (FOR BLOOD PRESSURE SUPPORT)
PREFILLED_SYRINGE | INTRAVENOUS | Status: DC | PRN
  Administered 2016-08-11 (×3): 80 ug via INTRAVENOUS
  Administered 2016-08-11 (×3): 40 ug via INTRAVENOUS

## 2016-08-11 MED ORDER — PROPOFOL 10 MG/ML IV BOLUS
INTRAVENOUS | Status: DC | PRN
  Administered 2016-08-11 (×2): 20 mg via INTRAVENOUS

## 2016-08-11 MED ORDER — METHOCARBAMOL 500 MG PO TABS
500.0000 mg | ORAL_TABLET | Freq: Four times a day (QID) | ORAL | Status: DC | PRN
  Filled 2016-08-11: qty 1

## 2016-08-11 SURGICAL SUPPLY — 64 items
BANDAGE ACE 6X5 VEL STRL LF (GAUZE/BANDAGES/DRESSINGS) ×3 IMPLANT
BANDAGE ESMARK 6X9 LF (GAUZE/BANDAGES/DRESSINGS) ×1 IMPLANT
BLADE SAGITTAL 13X1.27X60 (BLADE) ×2 IMPLANT
BLADE SAGITTAL 13X1.27X60MM (BLADE) ×1
BLADE SAW SGTL 83.5X18.5 (BLADE) ×3 IMPLANT
BLADE SURG 10 STRL SS (BLADE) ×3 IMPLANT
BNDG CMPR 9X6 STRL LF SNTH (GAUZE/BANDAGES/DRESSINGS) ×1
BNDG ESMARK 6X9 LF (GAUZE/BANDAGES/DRESSINGS) ×3
BOWL SMART MIX CTS (DISPOSABLE) ×3 IMPLANT
CAPT KNEE TOTAL 3 ×3 IMPLANT
CEMENT BONE SIMPLEX SPEEDSET (Cement) ×6 IMPLANT
CLOSURE STERI-STRIP 1/2X4 (GAUZE/BANDAGES/DRESSINGS) ×1
CLOSURE WOUND 1/2 X4 (GAUZE/BANDAGES/DRESSINGS) ×1
CLSR STERI-STRIP ANTIMIC 1/2X4 (GAUZE/BANDAGES/DRESSINGS) ×1 IMPLANT
COVER SURGICAL LIGHT HANDLE (MISCELLANEOUS) ×3 IMPLANT
CUFF TOURNIQUET SINGLE 34IN LL (TOURNIQUET CUFF) ×3 IMPLANT
DRAPE EXTREMITY T 121X128X90 (DRAPE) ×3 IMPLANT
DRAPE HALF SHEET 40X57 (DRAPES) ×3 IMPLANT
DRAPE INCISE IOBAN 66X45 STRL (DRAPES) ×6 IMPLANT
DRAPE U-SHAPE 47X51 STRL (DRAPES) ×3 IMPLANT
DRSG AQUACEL AG ADV 3.5X10 (GAUZE/BANDAGES/DRESSINGS) ×3 IMPLANT
DURAPREP 26ML APPLICATOR (WOUND CARE) ×6 IMPLANT
ELECT REM PT RETURN 9FT ADLT (ELECTROSURGICAL) ×3
ELECTRODE REM PT RTRN 9FT ADLT (ELECTROSURGICAL) ×1 IMPLANT
FILTER STRAW FLUID ASPIR (MISCELLANEOUS) IMPLANT
GLOVE BIOGEL M 7.0 STRL (GLOVE) IMPLANT
GLOVE BIOGEL PI IND STRL 7.5 (GLOVE) IMPLANT
GLOVE BIOGEL PI IND STRL 8.5 (GLOVE) ×1 IMPLANT
GLOVE BIOGEL PI INDICATOR 7.5 (GLOVE)
GLOVE BIOGEL PI INDICATOR 8.5 (GLOVE) ×2
GLOVE SURG ORTHO 8.0 STRL STRW (GLOVE) ×6 IMPLANT
GOWN STRL REUS W/ TWL LRG LVL3 (GOWN DISPOSABLE) ×1 IMPLANT
GOWN STRL REUS W/ TWL XL LVL3 (GOWN DISPOSABLE) ×2 IMPLANT
GOWN STRL REUS W/TWL 2XL LVL3 (GOWN DISPOSABLE) ×3 IMPLANT
GOWN STRL REUS W/TWL LRG LVL3 (GOWN DISPOSABLE) ×3
GOWN STRL REUS W/TWL XL LVL3 (GOWN DISPOSABLE) ×6
HANDPIECE INTERPULSE COAX TIP (DISPOSABLE) ×3
HOOD PEEL AWAY FACE SHEILD DIS (HOOD) ×9 IMPLANT
KIT BASIN OR (CUSTOM PROCEDURE TRAY) ×3 IMPLANT
KIT ROOM TURNOVER OR (KITS) ×3 IMPLANT
KNEE CAPITATED TOTAL 3 ×1 IMPLANT
MANIFOLD NEPTUNE II (INSTRUMENTS) ×3 IMPLANT
NDL 18GX1X1/2 (RX/OR ONLY) (NEEDLE) IMPLANT
NEEDLE 18GX1X1/2 (RX/OR ONLY) (NEEDLE) IMPLANT
NEEDLE 22X1 1/2 (OR ONLY) (NEEDLE) ×6 IMPLANT
NS IRRIG 1000ML POUR BTL (IV SOLUTION) ×3 IMPLANT
PACK TOTAL JOINT (CUSTOM PROCEDURE TRAY) ×3 IMPLANT
PAD ARMBOARD 7.5X6 YLW CONV (MISCELLANEOUS) ×6 IMPLANT
SET HNDPC FAN SPRY TIP SCT (DISPOSABLE) ×1 IMPLANT
STRIP CLOSURE SKIN 1/2X4 (GAUZE/BANDAGES/DRESSINGS) ×2 IMPLANT
SUCTION FRAZIER HANDLE 10FR (MISCELLANEOUS)
SUCTION TUBE FRAZIER 10FR DISP (MISCELLANEOUS) IMPLANT
SUT MNCRL AB 3-0 PS2 18 (SUTURE) ×3 IMPLANT
SUT VIC AB 0 CTB1 27 (SUTURE) ×6 IMPLANT
SUT VIC AB 1 CT1 27 (SUTURE) ×6
SUT VIC AB 1 CT1 27XBRD ANBCTR (SUTURE) ×2 IMPLANT
SUT VIC AB 2-0 CT1 27 (SUTURE) ×6
SUT VIC AB 2-0 CT1 TAPERPNT 27 (SUTURE) ×2 IMPLANT
SYR 20CC LL (SYRINGE) ×6 IMPLANT
SYR TB 1ML LUER SLIP (SYRINGE) ×2 IMPLANT
TOWEL OR 17X24 6PK STRL BLUE (TOWEL DISPOSABLE) ×3 IMPLANT
TOWEL OR 17X26 10 PK STRL BLUE (TOWEL DISPOSABLE) ×3 IMPLANT
TRAY CATH 16FR W/PLASTIC CATH (SET/KITS/TRAYS/PACK) ×2 IMPLANT
WRAP KNEE MAXI GEL POST OP (GAUZE/BANDAGES/DRESSINGS) ×3 IMPLANT

## 2016-08-11 NOTE — Anesthesia Postprocedure Evaluation (Signed)
Anesthesia Post Note  Patient: Veronica Phelps  Procedure(s) Performed: Procedure(s) (LRB): TOTAL KNEE ARTHROPLASTY (Right)     Patient location during evaluation: PACU Anesthesia Type: Regional and Spinal Level of consciousness: oriented and awake and alert Pain management: pain level controlled Vital Signs Assessment: post-procedure vital signs reviewed and stable Respiratory status: spontaneous breathing, respiratory function stable and patient connected to nasal cannula oxygen Cardiovascular status: blood pressure returned to baseline and stable Postop Assessment: no headache and no backache Anesthetic complications: no    Last Vitals:  Vitals:   08/11/16 1030 08/11/16 1111  BP:  (!) 122/52  Pulse: 62 62  Resp: 14 16  Temp: (!) 36.3 C (!) 36.2 C    Last Pain:  Vitals:   08/11/16 1111  TempSrc: Oral  PainSc:                  Chinmay Squier P Demarkis Gheen

## 2016-08-11 NOTE — Progress Notes (Signed)
Orthopedic Tech Progress Note Patient Details:  Veronica Phelps 10-20-1945 [id number]  CPM Right Knee CPM Right Knee: On Right Knee Flexion (Degrees): 90 Right Knee Extension (Degrees): 0 Additional Comments: trapeze bar patient helper   Hildred Priest 08/11/2016, 10:05 AM Viewed order from doctor's order list

## 2016-08-11 NOTE — Progress Notes (Signed)
Orthopedic Tech Progress Note Patient Details:  Veronica Phelps [birth year] [id number]  Patient ID: Veronica Phelps, female   DOB: 1945/03/13, 71 y.o.   MRN: [id number]  08/11/2016, 10:07 AM Delete one order for continuous passive motion machine

## 2016-08-11 NOTE — OR Nursing (Signed)
0915: in&out cath=700 dilute cyu, per protocol, no trauma.

## 2016-08-11 NOTE — Anesthesia Procedure Notes (Signed)
Anesthesia Regional Block: Adductor canal block   Pre-Anesthetic Checklist: ,, timeout performed, Correct Patient, Correct Site, Correct Laterality, Correct Procedure,, site marked, risks and benefits discussed, Surgical consent,  Pre-op evaluation,  At surgeon's request and post-op pain management  Laterality: Right  Prep: chloraprep       Needles:  Injection technique: Single-shot  Needle Type: Echogenic Stimulator Needle     Needle Length: 9cm  Needle Gauge: 21     Additional Needles:   Procedures: ultrasound guided,,,,,,,,  Narrative:  Start time: 08/11/2016 7:10 AM End time: 08/11/2016 7:20 AM Injection made incrementally with aspirations every 5 mL.  Performed by: Personally  Anesthesiologist: Adele Barthel P  Additional Notes: Functioning IV was confirmed and monitors were applied.  A 67mm 21ga Arrow echogenic stimulator needle was used. Sterile prep,hand hygiene and sterile gloves were used.  Negative aspiration and negative test dose prior to incremental administration of local anesthetic. The patient tolerated the procedure well.

## 2016-08-11 NOTE — Transfer of Care (Signed)
Immediate Anesthesia Transfer of Care Note  Patient: Veronica Phelps  Procedure(s) Performed: Procedure(s): TOTAL KNEE ARTHROPLASTY (Right)  Patient Location: PACU  Anesthesia Type:Spinal  Level of Consciousness: drowsy and patient cooperative  Airway & Oxygen Therapy: Patient Spontanous Breathing  Post-op Assessment: Report given to RN and Post -op Vital signs reviewed and stable  Post vital signs: Reviewed and stable  Last Vitals:  Vitals:   08/11/16 0549  BP: (!) 144/50  Pulse: 63  Resp: 18  Temp: 36.5 C    Last Pain:  Vitals:   08/11/16 0613  TempSrc:   PainSc: 3       Patients Stated Pain Goal: 4 (19/50/93 2671)  Complications: No apparent anesthesia complications

## 2016-08-11 NOTE — Anesthesia Procedure Notes (Signed)
Spinal  Patient location during procedure: OR Start time: 08/11/2016 7:35 AM End time: 08/11/2016 7:45 AM Staffing Anesthesiologist: Adele Barthel P Performed: anesthesiologist  Preanesthetic Checklist Completed: patient identified, surgical consent, pre-op evaluation, timeout performed, IV checked, risks and benefits discussed and monitors and equipment checked Spinal Block Patient position: sitting Prep: DuraPrep Patient monitoring: cardiac monitor, continuous pulse ox and blood pressure Approach: midline Location: L4-5 Injection technique: single-shot Needle Needle type: Quincke  Needle gauge: 22 G Needle length: 9 cm Assessment Sensory level: T10 Additional Notes Functioning IV was confirmed and monitors were applied. Sterile prep and drape, including hand hygiene and sterile gloves were used. The patient was positioned and the spine was prepped. The skin was anesthetized with lidocaine.  Free flow of clear CSF was obtained prior to injecting local anesthetic into the CSF.  The spinal needle aspirated freely following injection.  The needle was carefully withdrawn.  The patient tolerated the procedure well.

## 2016-08-11 NOTE — Op Note (Signed)
TOTAL KNEE REPLACEMENT OPERATIVE NOTE:  08/11/2016  2:05 PM  PATIENT:  Veronica Phelps  71 y.o. female  PRE-OPERATIVE DIAGNOSIS:  primary osteoarthritis right knee  POST-OPERATIVE DIAGNOSIS:  primary osteoarthritis right knee  PROCEDURE:  Procedure(s): TOTAL KNEE ARTHROPLASTY  SURGEON:  Surgeon(s): Vickey Huger, MD  PHYSICIAN ASSISTANT: Carlyon Shadow, [facility]   ANESTHESIA:   spinal  DRAINS: Hemovac  SPECIMEN: None  COUNTS:  Correct  TOURNIQUET:   Total Tourniquet Time Documented: Thigh (Right) - 43 minutes Total: Thigh (Right) - 43 minutes   DICTATION:  Indication for procedure:    The patient is a 71 y.o. female who has failed conservative treatment for primary osteoarthritis right knee.  Informed consent was obtained prior to anesthesia. The risks versus benefits of the operation were explain and in a way the patient can, and did, understand.   On the implant demand matching protocol, this patient scored 9.  Therefore, this patient was not receive a polyethylene insert with vitamin E which is a high demand implant.  Description of procedure:     The patient was taken to the operating room and placed under anesthesia.  The patient was positioned in the usual fashion taking care that all body parts were adequately padded and/or protected.  I foley catheter was not placed.  A tourniquet was applied and the leg prepped and draped in the usual sterile fashion.  The extremity was exsanguinated with the esmarch and tourniquet inflated to 350 mmHg.  Pre-operative range of motion was normal.  The knee was in 5 degree of mild varus.  A midline incision approximately 6-7 inches long was made with a #10 blade.  A new blade was used to make a parapatellar arthrotomy going 2-3 cm into the quadriceps tendon, over the patella, and alongside the medial aspect of the patellar tendon.  A synovectomy was then performed with the #10 blade and forceps. I then elevated the deep MCL off the  medial tibial metaphysis subperiosteally around to the semimembranosus attachment.    I everted the patella and used calipers to measure patellar thickness.  I used the reamer to ream down to appropriate thickness to recreate the native thickness.  I then removed excess bone with the rongeur and sagittal saw.  I used the appropriately sized template and drilled the three lug holes.  I then put the trial in place and measured the thickness with the calipers to ensure recreation of the native thickness.  The trial was then removed and the patella subluxed and the knee brought into flexion.  A homan retractor was place to retract and protect the patella and lateral structures.  A Z-retractor was place medially to protect the medial structures.  The extra-medullary alignment system was used to make cut the tibial articular surface perpendicular to the anamotic axis of the tibia and in 3 degrees of posterior slope.  The cut surface and alignment jig was removed.  I then used the intramedullary alignment guide to make a 6 valgus cut on the distal femur.  I then marked out the epicondylar axis on the distal femur.  The posterior condylar axis measured 3 degrees.  I then used the anterior referencing sizer and measured the femur to be a size 7.  The 4-In-1 cutting block was screwed into place in external rotation matching the posterior condylar angle, making our cuts perpendicular to the epicondylar axis.  Anterior, posterior and chamfer cuts were made with the sagittal saw.  The cutting block and cut pieces  were removed.  A lamina spreader was placed in 90 degrees of flexion.  The ACL, PCL, menisci, and posterior condylar osteophytes were removed.  A 12 mm spacer blocked was found to offer good flexion and extension gap balance after minimal in degree releasing.   The scoop retractor was then placed and the femoral finishing block was pinned in place.  The small sagittal saw was used as well as the lug drill to  finish the femur.  The block and cut surfaces were removed and the medullary canal hole filled with autograft bone from the cut pieces.  The tibia was delivered forward in deep flexion and external rotation.  A size D tray was selected and pinned into place centered on the medial 1/3 of the tibial tubercle.  The reamer and keel was used to prepare the tibia through the tray.    I then trialed with the size 7 femur, size D tibia, a 14 mm insert and the 32 patella.  I had excellent flexion/extension gap balance, excellent patella tracking.  Flexion was full and beyond 120 degrees; extension was zero.  These components were chosen and the staff opened them to me on the back table while the knee was lavaged copiously and the cement mixed.  The soft tissue was infiltrated with 60cc of exparel 1.3% through a 21 gauge needle.  I cemented in the components and removed all excess cement.  The polyethylene tibial component was snapped into place and the knee placed in extension while cement was hardening.  The capsule was infilltrated with 30cc of .25% Marcaine with epinephrine.  A hemovac was place in the joint exiting superolaterally.  A pain pump was place superomedially superficial to the arthrotomy.  Once the cement was hard, the tourniquet was let down.  Hemostasis was obtained.  The arthrotomy was closed with figure-8 #1 vicryl sutures.  The deep soft tissues were closed with #0 vicryls and the subcuticular layer closed with a running #2-0 vicryl.  The skin was reapproximated and closed with skin staples.  The wound was dressed with xeroform, 4 x4's, 2 ABD sponges, a single layer of webril and a TED stocking.   The patient was then awakened, extubated, and taken to the recovery room in stable condition.  BLOOD LOSS:  300cc DRAINS: 1 hemovac, 1 pain catheter COMPLICATIONS:  None.  PLAN OF CARE: Admit to inpatient   PATIENT DISPOSITION:  PACU - hemodynamically stable.   Delay start of Pharmacological  VTE agent (>24hrs) due to surgical blood loss or risk of bleeding:  not applicable  Please fax a copy of this op note to my office at [phone] (please only include page 1 and 2 of the Case Information op note)

## 2016-08-11 NOTE — Anesthesia Preprocedure Evaluation (Addendum)
Anesthesia Evaluation  Patient identified by MRN, date of birth, ID band Patient awake    Reviewed: Allergy & Precautions, H&P , NPO status , Patient's Chart, lab work & pertinent test results  History of Anesthesia Complications (+) PONV and history of anesthetic complications  Airway Mallampati: III  TM Distance: >3 FB Neck ROM: full    Dental no notable dental hx.    Pulmonary neg pulmonary ROS,    Pulmonary exam normal breath sounds clear to auscultation       Cardiovascular Exercise Tolerance: Good negative cardio ROS   Rhythm:regular Rate:Normal  ECG: NSR, rate 66   Neuro/Psych negative neurological ROS  negative psych ROS   GI/Hepatic negative GI ROS, Neg liver ROS,   Endo/Other  negative endocrine ROS  Renal/GU negative Renal ROS     Musculoskeletal  (+) Arthritis , Osteoarthritis,    Abdominal   Peds  Hematology negative hematology ROS (+)   Anesthesia Other Findings   Reproductive/Obstetrics                            Anesthesia Physical  Anesthesia Plan  ASA: II  Anesthesia Plan: Spinal and Regional   Post-op Pain Management:  Regional for Post-op pain   Induction: Intravenous  PONV Risk Score and Plan: 3 and Ondansetron, Dexamethasone, Propofol and Midazolam  Airway Management Planned:   Additional Equipment:   Intra-op Plan:   Post-operative Plan:   Informed Consent: I have reviewed the patients History and Physical, chart, labs and discussed the procedure including the risks, benefits and alternatives for the proposed anesthesia with the patient or authorized representative who has indicated his/her understanding and acceptance.   Dental Advisory Given and Dental advisory given  Plan Discussed with: CRNA and Surgeon  Anesthesia Plan Comments: (  )        Anesthesia Quick Evaluation

## 2016-08-11 NOTE — Progress Notes (Signed)
Physical Therapy Treatment Patient Details Name: Veronica Phelps MRN: [id number] DOB: 01/31/1945 Today's Date: 08/11/2016    History of Present Illness Pt is a 71 yo female with c/o R knee pain secondary to end stage R knee OA, s/p R TKA 08/11/16. PMH significant for L TKA, and OA.    PT Comments    Pt performed increased mobility during second session this afternoon.  Pt required +2 assistance to achieve standing and remains to complain of "feeling numb" from anesthesia.  Plan next session to progress gait training and perform stair training when patient is able.     Follow Up Recommendations  DC plan and follow up therapy as arranged by surgeon     Equipment Recommendations  None recommended by PT    Recommendations for Other Services       Precautions / Restrictions Precautions Precautions: Knee Precaution Booklet Issued: Yes (comment) Restrictions Weight Bearing Restrictions: Yes RLE Weight Bearing: Weight bearing as tolerated    Mobility  Bed Mobility               General bed mobility comments: Pt seated in recliner on arrival.    Transfers Overall transfer level: Needs assistance Equipment used: Rolling walker (2 wheeled) Transfers: Sit to/from Stand Sit to Stand: Mod assist;+2 physical assistance         General transfer comment: Required boost into standing, VC's for hand/LE placement, Pt attempted to stand with +1 assistance but unable to clear her bottom therefore requiring increased assistance.  Pt upon standing began to urinate on herself.  Required min assist once in standing during peri care.    Ambulation/Gait Ambulation/Gait assistance: Min assist;+2 physical assistance (Follow with chair) Ambulation Distance (Feet): 20 Feet Assistive device: Rolling walker (2 wheeled) Gait Pattern/deviations: Step-to pattern;Trunk flexed;Antalgic;Decreased dorsiflexion - right   Gait velocity interpretation: Below normal speed for age/gender General Gait  Details: Cues for R heel strike and knee extension in stance phase.  Close chair follow as patient reports numbness in her hips.  No buckling noted.     Stairs            Wheelchair Mobility    Modified Rankin (Stroke Patients Only)       Balance                                            Cognition Arousal/Alertness: Awake/alert Behavior During Therapy: WFL for tasks assessed/performed Overall Cognitive Status: Within Functional Limits for tasks assessed                                        Exercises Total Joint Exercises Ankle Circles/Pumps: AROM;Both;10 reps;Supine Quad Sets: AROM;Right;10 reps;Supine Towel Squeeze: AROM;Both;10 reps;Supine Short Arc Quad: AROM;10 reps;Supine;Right Heel Slides: AROM;Both;10 reps;Right;Supine Hip ABduction/ADduction: AROM;10 reps;Right;Supine Straight Leg Raises: AAROM;Right;10 reps;Supine    General Comments        Pertinent Vitals/Pain Pain Assessment: 0-10 Pain Score: 7  Pain Location: R knee Pain Descriptors / Indicators: Numbness;Heaviness Pain Intervention(s): Limited activity within patient's tolerance;Monitored during session    Home Living                      Prior Function            PT Goals (current  goals can now be found in the care plan section) Acute Rehab PT Goals Patient Stated Goal: go home tomorrow Potential to Achieve Goals: Good Progress towards PT goals: Progressing toward goals    Frequency    7X/week      PT Plan Current plan remains appropriate    Co-evaluation              AM-PAC PT "6 Clicks" Daily Activity  Outcome Measure  Difficulty turning over in bed (including adjusting bedclothes, sheets and blankets)?: A Lot Difficulty moving from lying on back to sitting on the side of the bed? : Total Difficulty sitting down on and standing up from a chair with arms (e.g., wheelchair, bedside commode, etc,.)?: Total Help needed  moving to and from a bed to chair (including a wheelchair)?: Total Help needed walking in hospital room?: Total Help needed climbing 3-5 steps with a railing? : Total 6 Click Score: 7    End of Session Equipment Utilized During Treatment: Gait belt Activity Tolerance: Patient tolerated treatment well (Remains limited due to dizziness and residual effects of anesthesia) Patient left: in chair;with call bell/phone within reach Nurse Communication: Mobility status PT Visit Diagnosis: Unsteadiness on feet (R26.81);Other abnormalities of gait and mobility (R26.89);Muscle weakness (generalized) (M62.81);Difficulty in walking, not elsewhere classified (R26.2);Pain Pain - Right/Left: Right Pain - part of body: Knee     Time: 8638-1771 PT Time Calculation (min) (ACUTE ONLY): 24 min  Charges:  $Gait Training: 8-22 mins $Therapeutic Exercise: 8-22 mins                    G Codes:       Veronica Phelps, PTA pager [phone]    Veronica Phelps 08/11/2016, 5:21 PM

## 2016-08-11 NOTE — Evaluation (Signed)
Physical Therapy Evaluation Patient Details Name: Veronica Phelps MRN: [id number] DOB: 10/21/1945 Today's Date: 08/11/2016   History of Present Illness  Pt is a 71 yo female with c/o R knee pain secondary to end stage R knee OA, s/p R TKA 08/11/16. PMH significant for L TKA, and OA.  Clinical Impression  Pt is s/p TKA resulting in the deficits listed below (see PT Problem List). Pt is currently limited by residual anestheti. Pt is minA for bed mobility and modA for squat pivot transfer to chair. Pt will benefit from skilled PT to increase their independence and safety with mobility to allow discharge to the venue listed below.      Follow Up Recommendations DC plan and follow up therapy as arranged by surgeon    Equipment Recommendations  None recommended by PT    Recommendations for Other Services       Precautions / Restrictions Precautions Precautions: Knee Precaution Booklet Issued: Yes (comment) Restrictions Weight Bearing Restrictions: Yes RLE Weight Bearing: Weight bearing as tolerated      Mobility  Bed Mobility Overal bed mobility: Needs Assistance Bed Mobility: Supine to Sit     Supine to sit: Min assist     General bed mobility comments: minA for LE mangement to floor and pad scoot of hips to EoB  Transfers Overall transfer level: Needs assistance Equipment used: None Transfers: Squat Pivot Transfers     Squat pivot transfers: Mod assist     General transfer comment: modA for physical assist to upright, L LE with decreased strength for powerup, R LE no strength for power up         Balance Overall balance assessment: Needs assistance Sitting-balance support: No upper extremity supported;Feet supported Sitting balance-Leahy Scale: Good     Standing balance support: Bilateral upper extremity supported Standing balance-Leahy Scale: Zero Standing balance comment: requires maxA to maintain balance                               Pertinent Vitals/Pain Pain Assessment: 0-10 Pain Score: 7  Pain Location: across top of R knee with end range flexion Pain Descriptors / Indicators: Sharp Pain Intervention(s): Monitored during session  VSS    Home Living Family/patient expects to be discharged to:: Private residence Living Arrangements: Spouse/significant other Available Help at Discharge: Available 24 hours/day;Family Type of Home: House Home Access: Stairs to enter   Technical brewer of Steps: 3 Home Layout: One level Home Equipment: Environmental consultant - 2 wheels;Bedside commode      Prior Function Level of Independence: Independent         Comments: pain with ambulation, community ambulator     Hand Dominance   Dominant Hand: Left    Extremity/Trunk Assessment   Upper Extremity Assessment Upper Extremity Assessment: Defer to OT evaluation    Lower Extremity Assessment Lower Extremity Assessment: RLE deficits/detail;LLE deficits/detail RLE Deficits / Details: R TKA, ROM limited by pain, strength limited by spinal block RLE: Unable to fully assess due to pain RLE Sensation: decreased light touch (below knee) LLE Deficits / Details: residual spinal block, decreased strength in L LE grossly 3-/5 LLE Sensation: decreased light touch (below knee)    Cervical / Trunk Assessment Cervical / Trunk Assessment: Normal  Communication   Communication: No difficulties  Cognition Arousal/Alertness: Awake/alert Behavior During Therapy: WFL for tasks assessed/performed Overall Cognitive Status: Within Functional Limits for tasks assessed  Exercises Total Joint Exercises Ankle Circles/Pumps: AROM;Both;20 reps;Seated Quad Sets: AROM;Right;10 reps;Seated Gluteal Sets: AROM;Both;Seated;10 reps Long Arc Quad: AROM;Right;10 reps;Seated   Assessment/Plan    PT Assessment Patient needs continued PT services  PT Problem List Decreased  strength;Decreased range of motion;Decreased activity tolerance;Decreased balance;Decreased mobility;Decreased coordination;Impaired sensation;Pain       PT Treatment Interventions DME instruction;Gait training;Stair training;Functional mobility training;Therapeutic activities;Therapeutic exercise;Balance training;Patient/family education    PT Goals (Current goals can be found in the Care Plan section)  Acute Rehab PT Goals Patient Stated Goal: go home tomorrow PT Goal Formulation: With patient Time For Goal Achievement: 08/18/16 Potential to Achieve Goals: Good    Frequency 7X/week    AM-PAC PT "6 Clicks" Daily Activity  Outcome Measure Difficulty turning over in bed (including adjusting bedclothes, sheets and blankets)?: A Lot Difficulty moving from lying on back to sitting on the side of the bed? : Total Difficulty sitting down on and standing up from a chair with arms (e.g., wheelchair, bedside commode, etc,.)?: Total Help needed moving to and from a bed to chair (including a wheelchair)?: Total Help needed walking in hospital room?: Total Help needed climbing 3-5 steps with a railing? : Total 6 Click Score: 7    End of Session Equipment Utilized During Treatment: Gait belt Activity Tolerance: Other (comment) (limited by residual effects of anesthesia) Patient left: in chair;with call bell/phone within reach Nurse Communication: Mobility status PT Visit Diagnosis: Unsteadiness on feet (R26.81);Other abnormalities of gait and mobility (R26.89);Muscle weakness (generalized) (M62.81);Difficulty in walking, not elsewhere classified (R26.2);Pain Pain - Right/Left: Right Pain - part of body: Knee    Time: 1345-1420 PT Time Calculation (min) (ACUTE ONLY): 35 min   Charges:   PT Evaluation $PT Eval Low Complexity: 1 Procedure PT Treatments $Therapeutic Exercise: 8-22 mins   PT G Codes:   PT G-Codes **NOT FOR INPATIENT CLASS** Functional Assessment Tool Used: AM-PAC 6 Clicks  Basic Mobility Functional Limitation: Mobility: Walking and moving around Mobility: Walking and Moving Around Current Status (S5681): At least 80 percent but less than 100 percent impaired, limited or restricted Mobility: Walking and Moving Around Goal Status [id number]): At least 1 percent but less than 20 percent impaired, limited or restricted    Benjamine Mola B. Migdalia Dk PT, DPT Acute Rehabilitation  [phone] Pager [phone]    [place] 08/11/2016, 3:19 PM

## 2016-08-12 ENCOUNTER — Encounter (HOSPITAL_COMMUNITY): Payer: Self-pay | Admitting: Orthopedic Surgery

## 2016-08-12 DIAGNOSIS — M1711 Unilateral primary osteoarthritis, right knee: Secondary | ICD-10-CM | POA: Diagnosis not present

## 2016-08-12 LAB — BASIC METABOLIC PANEL
Anion gap: 7 (ref 5–15)
BUN: 8 mg/dL (ref 6–20)
CO2: 23 mmol/L (ref 22–32)
Calcium: 9 mg/dL (ref 8.9–10.3)
Chloride: 107 mmol/L (ref 101–111)
Creatinine, Ser: 0.72 mg/dL (ref 0.44–1.00)
GFR calc Af Amer: >60 mL/min (ref >60)
GFR calc non Af Amer: >60 mL/min (ref >60)
GLUCOSE: 128 mg/dL — AB (ref 65–99)
POTASSIUM: 4.3 mmol/L (ref 3.5–5.1)
Sodium: 137 mmol/L (ref 135–145)

## 2016-08-12 LAB — CBC
HEMATOCRIT: 37.7 % (ref 36.0–46.0)
HEMOGLOBIN: 12.5 g/dL (ref 12.0–15.0)
MCH: 31.3 pg (ref 26.0–34.0)
MCHC: 33.2 g/dL (ref 30.0–36.0)
MCV: 94.3 fL (ref 78.0–100.0)
Platelets: 247 10*3/uL (ref 150–400)
RBC: 4 MIL/uL (ref 3.87–5.11)
RDW: 12.9 % (ref 11.5–15.5)
WBC: 11.6 10*3/uL — ABNORMAL HIGH (ref 4.0–10.5)

## 2016-08-12 MED ORDER — METHOCARBAMOL 500 MG PO TABS: 500.0000 mg | ORAL_TABLET | Freq: Four times a day (QID) | ORAL | 0 refills | Status: DC | PRN

## 2016-08-12 MED ORDER — HYDROCODONE-ACETAMINOPHEN 10-325 MG PO TABS: 1.0000 | ORAL_TABLET | Freq: Four times a day (QID) | ORAL | 0 refills | Status: DC | PRN

## 2016-08-12 MED ORDER — ASPIRIN 325 MG PO TBEC: 325.0000 mg | DELAYED_RELEASE_TABLET | Freq: Two times a day (BID) | ORAL | 0 refills | Status: DC

## 2016-08-12 NOTE — Care Management Note (Signed)
Case Management Note  Patient Details  Name: Veronica Phelps MRN: [id number] Date of Birth: 1945-04-06  Subjective/Objective: 71 yr old female s/p right total knee arthroplasty.             Action/Plan: Case manager spoke with patient  Concerning discharge plan and DME needs. Patient was preoperatively setup with [facility], no changes. She has RW and 3in1, CPM has been delivered to her home. Patient says she will have family support at discharge.   Expected Discharge Date:  08/12/16               Expected Discharge Plan:  [place]  In-House Referral:  NA  Discharge planning Services  CM Consult  Post Acute Care Choice:  Home Health, Durable Medical Equipment Choice offered to:  Patient  DME Arranged:  CPM (HAS RW) DME Agency:  [employer]  HH Arranged:  PT [place] Agency:  [facility] (formerly [employer])  Status of Service:  Completed, signed off  If discussed at [employer], dates discussed:    Additional Comments:  Ninfa Meeker, RN 08/12/2016, 1:54 PM

## 2016-08-12 NOTE — Progress Notes (Signed)
Pt ready for d/c home today per MD. Pt met PT/OT goals, has needed equipment at home. Discharge instructions and prescriptions reviewed with pt, all questions answered.   [place], Jerry Caras

## 2016-08-12 NOTE — Progress Notes (Signed)
Physical Therapy Treatment Patient Details Name: Veronica Phelps MRN: [id number] DOB: 02-22-1945 Today's Date: 08/12/2016    History of Present Illness Pt is a 71 yo female with c/o R knee pain secondary to end stage R knee OA, s/p R TKA 08/11/16. PMH significant for L TKA, and OA.    PT Comments    Continuing work on functional mobility and activity tolerance, with noted very nice imporvements in gait and activity tolerance; Walking household distance with nice, stable knee; stair training complete; OK for dc home from PT standpoint    Follow Up Recommendations  DC plan and follow up therapy as arranged by surgeon     Equipment Recommendations  None recommended by PT    Recommendations for Other Services       Precautions / Restrictions Precautions Precautions: Knee Precaution Comments: Pt educated to not allow any pillow or bolster under knee for healing with optimal range of motion.  Restrictions RLE Weight Bearing: Weight bearing as tolerated    Mobility  Bed Mobility Overal bed mobility: Needs Assistance Bed Mobility: Sit to Supine       Sit to supine: Min assist   General bed mobility comments: Min assist to help RLE into bed  Transfers Overall transfer level: Needs assistance Equipment used: Rolling walker (2 wheeled) Transfers: Sit to/from Stand Sit to Stand: Min guard         General transfer comment: pt with education on hand placement  Ambulation/Gait Ambulation/Gait assistance: Min guard Ambulation Distance (Feet): 200 Feet Assistive device: Rolling walker (2 wheeled) Gait Pattern/deviations: Step-through pattern (emerging)     General Gait Details: Cues to activate quad for R stance stability   Stairs Stairs: Yes   Stair Management: One rail Right;Step to pattern;Sideways Number of Stairs: 2 General stair comments: verbal and demo cues for technique  Wheelchair Mobility    Modified Rankin (Stroke Patients Only)       Balance     Sitting balance-Leahy Scale: Good       Standing balance-Leahy Scale: Fair Standing balance comment: much improved                            Cognition Arousal/Alertness: Awake/alert Behavior During Therapy: WFL for tasks assessed/performed Overall Cognitive Status: Within Functional Limits for tasks assessed                                        Exercises      General Comments        Pertinent Vitals/Pain Pain Assessment: 0-10 Pain Score: 3  Pain Location: r knee Pain Descriptors / Indicators: Operative site guarding Pain Intervention(s): Monitored during session    Home Living                      Prior Function            PT Goals (current goals can now be found in the care plan section) Acute Rehab PT Goals Patient Stated Goal: to go home today PT Goal Formulation: With patient Time For Goal Achievement: 08/18/16 Potential to Achieve Goals: Good Progress towards PT goals: Progressing toward goals    Frequency    7X/week      PT Plan Current plan remains appropriate    Co-evaluation  AM-PAC PT "6 Clicks" Daily Activity  Outcome Measure  Difficulty turning over in bed (including adjusting bedclothes, sheets and blankets)?: None Difficulty moving from lying on back to sitting on the side of the bed? : None Difficulty sitting down on and standing up from a chair with arms (e.g., wheelchair, bedside commode, etc,.)?: A Little Help needed moving to and from a bed to chair (including a wheelchair)?: A Little Help needed walking in hospital room?: None Help needed climbing 3-5 steps with a railing? : A Little 6 Click Score: 21    End of Session Equipment Utilized During Treatment: Gait belt Activity Tolerance: Patient tolerated treatment well Patient left: in bed;in CPM;with call bell/phone within reach   PT Visit Diagnosis: Unsteadiness on feet (R26.81);Other abnormalities of gait and mobility  (R26.89);Muscle weakness (generalized) (M62.81);Difficulty in walking, not elsewhere classified (R26.2);Pain Pain - Right/Left: Right Pain - part of body: Knee     Time: 3818-2993 PT Time Calculation (min) (ACUTE ONLY): 48 min  Charges:  $Gait Training: 23-37 mins $Therapeutic Activity: 8-22 mins                    G Codes:       Roney Marion, PT  Acute Rehabilitation Services Pager [phone] Office [phone] '   [name] 08/12/2016, 12:34 PM

## 2016-08-12 NOTE — Evaluation (Signed)
Occupational Therapy Evaluation Patient Details Name: Veronica Phelps MRN: [id number] DOB: [birth year] Today's Date: 08/12/2016    History of Present Illness Pt is a 71 yo female with c/o R knee pain secondary to end stage R knee OA, s/p R TKA 08/11/16. PMH significant for L TKA, and OA.   Clinical Impression   Patient is s/p R TKA surgery resulting in functional limitations due to the deficits listed below (see OT problem list). PTA was independent with all adls. Pt requires min (A) this session due to safety with RW and sequence.  Patient will benefit from skilled OT acutely to increase independence and safety with ADLS to allow discharge home .     Follow Up Recommendations  No OT follow up    Equipment Recommendations  None recommended by OT    Recommendations for Other Services       Precautions / Restrictions Precautions Precautions: Knee Restrictions Weight Bearing Restrictions: Yes RLE Weight Bearing: Weight bearing as tolerated      Mobility Bed Mobility Overal bed mobility: Needs Assistance Bed Mobility: Supine to Sit     Supine to sit: Min guard     General bed mobility comments: used sheet as leg lift to progress to EOB  Transfers Overall transfer level: Needs assistance Equipment used: Rolling walker (2 wheeled) Transfers: Sit to/from Stand Sit to Stand: Min assist         General transfer comment: pt with education on hand placement    Balance Overall balance assessment: Needs assistance Sitting-balance support: No upper extremity supported Sitting balance-Leahy Scale: Good     Standing balance support: Bilateral upper extremity supported;During functional activity Standing balance-Leahy Scale: Zero                             ADL either performed or assessed with clinical judgement   ADL Overall ADL's : Needs assistance/impaired Eating/Feeding: Independent   Grooming: Wash/dry Armed forces training and education officer: Minimal assistance;Ambulation;BSC Toilet Transfer Details (indicate cue type and reason): educated on RW sequence for safety Toileting- Clothing Manipulation and Hygiene: Min guard;Sitting/lateral lean       Functional mobility during ADLs: Minimal assistance;Rolling walker General ADL Comments: pt completed transfer with one person (A) .      Vision Baseline Vision/History: Wears glasses Wears Glasses: At all times       Perception     Praxis      Pertinent Vitals/Pain Pain Assessment: Faces Faces Pain Scale: Hurts little more Pain Location: r knee Pain Descriptors / Indicators: Operative site guarding Pain Intervention(s): Monitored during session;Premedicated before session;Repositioned     Hand Dominance Left   Extremity/Trunk Assessment Upper Extremity Assessment Upper Extremity Assessment: Overall WFL for tasks assessed   Lower Extremity Assessment Lower Extremity Assessment: Defer to PT evaluation   Cervical / Trunk Assessment Cervical / Trunk Assessment: Normal   Communication Communication Communication: No difficulties   Cognition Arousal/Alertness: Awake/alert Behavior During Therapy: WFL for tasks assessed/performed Overall Cognitive Status: Within Functional Limits for tasks assessed                                     General Comments  dressing dry and ace wrap still in place    Exercises     Shoulder Instructions  Home Living Family/patient expects to be discharged to:: Private residence Living Arrangements: Spouse/significant other Available Help at Discharge: Available 24 hours/day;Family Type of Home: House Home Access: Stairs to enter [employer] of Steps: 3 Entrance Stairs-Rails: [place]: One level     Bathroom Shower/Tub: Occupational psychologist: Standard Bathroom Accessibility: Yes   Home Equipment: Environmental consultant - 2 wheels;Bedside commode          Prior  Functioning/Environment Level of Independence: Independent        Comments: pain with ambulation, community ambulator        OT Problem List: Decreased strength;Decreased range of motion;Decreased activity tolerance;Impaired balance (sitting and/or standing);Decreased safety awareness;Decreased knowledge of use of DME or AE;Decreased knowledge of precautions;Pain      OT Treatment/Interventions: Self-care/ADL training;Therapeutic exercise;DME and/or AE instruction;Therapeutic activities;Visual/perceptual remediation/compensation;Patient/family education;Balance training    OT Goals(Current goals can be found in the care plan section) Acute Rehab OT Goals Patient Stated Goal: to go home today OT Goal Formulation: With patient Time For Goal Achievement: 08/26/16 Potential to Achieve Goals: Good ADL Goals Pt Will Perform Lower Body Dressing: with min guard assist;sit to/from stand Pt Will Perform Tub/Shower Transfer: Shower transfer;rolling walker;ambulating;with supervision  OT Frequency: Min 2X/week   Barriers to D/C:            Co-evaluation              AM-PAC PT "6 Clicks" Daily Activity     Outcome Measure Help from another person eating meals?: None Help from another person taking care of personal grooming?: None Help from another person toileting, which includes using toliet, bedpan, or urinal?: None Help from another person bathing (including washing, rinsing, drying)?: A Little Help from another person to put on and taking off regular upper body clothing?: None Help from another person to put on and taking off regular lower body clothing?: A Little 6 Click Score: 22   End of Session Equipment Utilized During Treatment: Gait belt;Rolling walker CPM Right Knee Additional Comments: pt was o-90 on OT arrival Nurse Communication: Mobility status;Precautions  Activity Tolerance: Patient tolerated treatment well Patient left: in chair;with call bell/phone within  reach  OT Visit Diagnosis: Unsteadiness on feet (R26.81)                Time: 0277-4128 OT Time Calculation (min): 15 min Charges:  OT General Charges $OT Visit: 1 Procedure OT Evaluation $OT Eval Moderate Complexity: 1 Procedure G-Codes: OT G-codes **NOT FOR INPATIENT CLASS** Functional Assessment Tool Used: Clinical judgement Functional Limitation: Self care Self Care Current Status (N8676): At least 1 percent but less than 20 percent impaired, limited or restricted Self Care Goal Status (H2094): 0 percent impaired, limited or restricted    Jeri Modena   OTR/L Pager: [phone] Office: [phone] .   Parke Poisson B 08/12/2016, 8:37 AM

## 2016-08-12 NOTE — Progress Notes (Signed)
Pt placed on bone foam, medicated.

## 2016-08-12 NOTE — Progress Notes (Signed)
SPORTS MEDICINE AND JOINT REPLACEMENT  Lara Mulch, MD    Carlyon Shadow, PA-C [place], [place], [place]  [address]                             [phone]   PROGRESS NOTE  Subjective:  negative for Chest Pain  negative for Shortness of Breath  negative for Nausea/Vomiting   negative for Calf Pain  negative for Bowel Movement   Tolerating Diet: yes         Patient reports pain as 4 on 0-10 scale.    Objective: Vital signs in last 24 hours:   Patient Vitals for the past 24 hrs:  BP Temp Temp src Pulse Resp SpO2  08/12/16 0100 (!) 108/57 97.6 F (36.4 C) Oral 64 20 99 %  08/11/16 2011 128/60 97.7 F (36.5 C) Oral 66 18 95 %  08/11/16 1111 (!) 122/52 (!) 97.1 F (36.2 C) Oral 62 16 99 %  08/11/16 1049 - - - - - 99 %  08/11/16 1030 - (!) 97.3 F (36.3 C) - 62 14 99 %  08/11/16 1028 (!) 115/59 - - 60 10 95 %  08/11/16 1013 120/67 - - 63 10 97 %  08/11/16 0958 118/69 - - 67 11 99 %  08/11/16 0943 123/64 - - 71 (!) 9 97 %  08/11/16 0930 - (!) 97 F (36.1 C) Temporal - - -  08/11/16 0928 119/63 - - 85 (!) 6 99 %    @flow {1959:LAST@   Intake/Output from previous day:   07/16 0701 - 07/17 0700 In: 1450 [I.V.:1400] Out: 1220 [Urine:1200]   Intake/Output this shift:   No intake/output data recorded.   Intake/Output      07/16 0701 - 07/17 0700 07/17 0701 - 07/18 0700   I.V. (mL/kg) 1400 (16.3)    Other 50    Total Intake(mL/kg) 1450 (16.9)    Urine (mL/kg/hr) 1200 (0.6)    Blood 20    Total Output 1220     Net +230          Urine Occurrence 3 x       LABORATORY DATA:  Recent Labs  08/12/16 0445  WBC 11.6*  HGB 12.5  HCT 37.7  PLT 247    Recent Labs  08/12/16 0445  NA 137  K 4.3  CL 107  CO2 23  BUN 8  CREATININE 0.72  GLUCOSE 128*  CALCIUM 9.0   Lab Results  Component Value Date   INR 0.88 03/04/2013    Examination:  General appearance: alert, cooperative and no distress Extremities: extremities normal, atraumatic, no  cyanosis or edema  Wound Exam: clean, dry, intact   Drainage:  None: wound tissue dry  Motor Exam: Quadriceps and Hamstrings Intact  Sensory Exam: Superficial Peroneal, Deep Peroneal and Tibial normal   Assessment:    1 Day Post-Op  Procedure(s) (LRB): TOTAL KNEE ARTHROPLASTY (Right)  ADDITIONAL DIAGNOSIS:  Active Problems:   S/P total knee replacement    Plan: Physical Therapy as ordered Weight Bearing as Tolerated (WBAT)  DVT Prophylaxis:  Aspirin  DISCHARGE PLAN: Home  DISCHARGE NEEDS: HHPT   Patient looks great, anticipate D/C home today         Donia Ast 08/12/2016, 7:31 AM

## 2016-08-12 NOTE — Discharge Summary (Signed)
SPORTS MEDICINE & JOINT REPLACEMENT   Lara Mulch, MD   Carlyon Shadow, PA-C [address], [place], [place]  [address]                             [phone]  PATIENT ID: Veronica Phelps        MRN:  [id number]          DOB/AGE: 08/02/1945 / 71 y.o.    DISCHARGE SUMMARY  ADMISSION DATE:    08/11/2016 DISCHARGE DATE:   08/12/2016   ADMISSION DIAGNOSIS: primary osteoarthritis right knee    DISCHARGE DIAGNOSIS:  primary osteoarthritis right knee    ADDITIONAL DIAGNOSIS: Active Problems:   S/P total knee replacement  Past Medical History:  Diagnosis Date  . Anemia   . Arthritis    knees - no meds  . Bilateral ovarian cysts   . Complication of anesthesia    takes awhile to wake up.  Also see my note about her vocal cords  . PONV (postoperative nausea and vomiting)   . SVD (spontaneous vaginal delivery)    x 4    PROCEDURE: Procedure(s): TOTAL KNEE ARTHROPLASTY on 08/11/2016  CONSULTS:    HISTORY:  See H&P in chart  HOSPITAL COURSE:  Veronica Phelps is a 71 y.o. admitted on 08/11/2016 and found to have a diagnosis of primary osteoarthritis right knee.  After appropriate laboratory studies were obtained  they were taken to the operating room on 08/11/2016 and underwent Procedure(s): TOTAL KNEE ARTHROPLASTY.   They were given perioperative antibiotics:  Anti-infectives    Start     Dose/Rate Route Frequency Ordered Stop   08/11/16 1330  ceFAZolin (ANCEF) IVPB 1 g/50 mL premix     1 g 100 mL/hr over 30 Minutes Intravenous Every 6 hours 08/11/16 1048 08/11/16 1911   08/11/16 0700  ceFAZolin (ANCEF) IVPB 2g/100 mL premix     2 g 200 mL/hr over 30 Minutes Intravenous To [facility] 08/08/16 1241 08/11/16 0747    .  Patient given tranexamic acid IV or topical and exparel intra-operatively.  Tolerated the procedure well.    POD# 1: Vital signs were stable.  Patient denied Chest pain, shortness of breath, or calf pain.  Patient was started on Lovenox 30 mg  subcutaneously twice daily at 8am.  Consults to PT, OT, and care management were made.  The patient was weight bearing as tolerated.  CPM was placed on the operative leg 0-90 degrees for 6-8 hours a day. When out of the CPM, patient was placed in the foam block to achieve full extension. Incentive spirometry was taught.  Dressing was changed.       POD #2, Continued  PT for ambulation and exercise program.  IV saline locked.  O2 discontinued.    The remainder of the hospital course was dedicated to ambulation and strengthening.   The patient was discharged on 1 Day Post-Op in  Good condition.  Blood products given:none  DIAGNOSTIC STUDIES: Recent vital signs: Patient Vitals for the past 24 hrs:  BP Temp Temp src Pulse Resp SpO2  08/12/16 0100 (!) 108/57 97.6 F (36.4 C) Oral 64 20 99 %  08/11/16 2011 128/60 97.7 F (36.5 C) Oral 66 18 95 %       Recent laboratory studies:  Recent Labs  08/12/16 0445  WBC 11.6*  HGB 12.5  HCT 37.7  PLT 247    Recent Labs  08/12/16  0445  NA 137  K 4.3  CL 107  CO2 23  BUN 8  CREATININE 0.72  GLUCOSE 128*  CALCIUM 9.0   Lab Results  Component Value Date   INR 0.88 03/04/2013     Recent Radiographic Studies :  No results found.  DISCHARGE INSTRUCTIONS:   DISCHARGE MEDICATIONS:   Allergies as of 08/12/2016      Reactions   Propoxyphene Other (See Comments)   fainting   Ultram [tramadol] Other (See Comments)   Dizziness, and headaches   Oxycontin [oxycodone Hcl] Other (See Comments)   Dizziness      Medication List    STOP taking these medications   acetaminophen 325 MG tablet Commonly known as:  TYLENOL   acetaminophen-codeine 300-30 MG tablet Commonly known as:  TYLENOL #3   ibuprofen 600 MG tablet Commonly known as:  ADVIL,MOTRIN   NONFORMULARY OR COMPOUNDED ITEM   oxyCODONE-acetaminophen 5-325 MG tablet Commonly known as:  ROXICET     TAKE these medications   aspirin 325 MG EC tablet Take 1 tablet (325  mg total) by mouth 2 (two) times daily.   docusate sodium 100 MG capsule Commonly known as:  COLACE Take 1 capsule (100 mg total) by mouth 2 (two) times daily.   HYDROcodone-acetaminophen 10-325 MG tablet Commonly known as:  NORCO Take 1 tablet by mouth every 6 (six) hours as needed.   methocarbamol 500 MG tablet Commonly known as:  ROBAXIN Take 1-2 tablets (500-1,000 mg total) by mouth every 6 (six) hours as needed for muscle spasms.            Durable Medical Equipment        Start     Ordered   08/11/16 1049  DME Walker rolling  Once    Question:  Patient needs a walker to treat with the following condition  Answer:  S/P total knee replacement   08/11/16 1048   08/11/16 1049  DME 3 n 1  Once     08/11/16 1048   08/11/16 1049  DME Bedside commode  Once    Question:  Patient needs a bedside commode to treat with the following condition  Answer:  S/P total knee replacement   08/11/16 1048      FOLLOW UP VISIT:    DISPOSITION: HOME VS. SNF  CONDITION:  Good   Donia Ast 08/12/2016, 12:32 PM

## 2017-03-11 ENCOUNTER — Encounter: Payer: Self-pay | Admitting: Neurology

## 2017-03-31 NOTE — Progress Notes (Signed)
Veronica Phelps was seen today in the movement disorders clinic for neurologic consultation at the request of Lawerance Cruel, MD.  The consultation is for the evaluation of jaw and chin tremor. The records that were made available to me were reviewed.  This patient is accompanied in the office by her spouse who supplements the history.  Tremor started 1 year ago in the chin but she has had had tremor for 10 years.  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  Knee replacement was done last year with some complications and chin tremor was more pronounced then  Specific Symptoms:  Tremor: Yes.    - notes some tremor at rest (especially husband notes this when holding hands at church).  Doesn't drink caffeine/EtOH.  She doesn't notice chin/jaw tremor.  Anxiety doesn't change it but fatigue may.  Tremor doesn't really bother the patient.   Family hx of similar:  No. - did have cousin with HD Voice: no change Sleep: sleeping well  Vivid Dreams:  No.  Acting out dreams:  Yes.   - will talk some in sleep Wet Pillows: No. Postural symptoms:  Yes.  , some mild issues since knee surgery as knee doesn't feel right  Falls?  No. Bradykinesia symptoms: no bradykinesia noted Loss of smell:  No. Loss of taste:  No. Urinary Incontinence:  No. Difficulty Swallowing:  No. Handwriting, micrographia: No. Trouble with ADL's:  No.  Trouble buttoning clothing: No. Depression:  No. Memory changes:  No. N/V:  No. Lightheaded:  No.  Syncope: No.   Neuroimaging of the brain has not previously been performed.   PREVIOUS MEDICATIONS: none to date  ALLERGIES:   Allergies  Allergen Reactions  . Propoxyphene Other (See Comments)    fainting  . Ultram [Tramadol] Other (See Comments)    Dizziness, and headaches  . Oxycontin [Oxycodone Hcl] Other (See Comments)    Dizziness    CURRENT MEDICATIONS:  Outpatient Encounter  Medications as of 04/02/2017  Medication Sig  . Probiotic Product (PROBIOTIC-10 PO) Take by mouth.  . [DISCONTINUED] aspirin EC 325 MG EC tablet Take 1 tablet (325 mg total) by mouth 2 (two) times daily.  . [DISCONTINUED] docusate sodium (COLACE) 100 MG capsule Take 1 capsule (100 mg total) by mouth 2 (two) times daily. (Patient not taking: Reported on 06/06/2014)  . [DISCONTINUED] HYDROcodone-acetaminophen (NORCO) 10-325 MG tablet Take 1 tablet by mouth every 6 (six) hours as needed.  . [DISCONTINUED] methocarbamol (ROBAXIN) 500 MG tablet Take 1-2 tablets (500-1,000 mg total) by mouth every 6 (six) hours as needed for muscle spasms.   No facility-administered encounter medications on file as of 04/02/2017.     PAST MEDICAL HISTORY:   Past Medical History:  Diagnosis Date  . Anemia   . Arthritis    knees - no meds  . Bilateral ovarian cysts   . Complication of anesthesia    takes awhile to wake up.  Also see my note about her vocal cords  . PONV (postoperative nausea and vomiting)   . SVD (spontaneous vaginal delivery)    x 4    PAST SURGICAL HISTORY:   Past Surgical History:  Procedure Laterality Date  . APPENDECTOMY    . BLEPHAROPLASTY    . CATARACT EXTRACTION, BILATERAL    . HERNIA REPAIR     1970s x2 -80 y/o  . KNEE ARTHROSCOPY Left 06/12/2014  Procedure: LEFT KNEE ARTHROSCOPY ;  Surgeon: Vickey Huger, MD;  Location: [place];  Service: Orthopedics;  Laterality: Left;  . KNEE ARTHROSCOPY W/ MENISCAL REPAIR     left    11/2011  . LAPAROSCOPIC APPENDECTOMY N/A 03/13/2014   Procedure: APPENDECTOMY LAPAROSCOPIC;  Surgeon: Jackolyn Confer, MD;  Location: [facility];  Service: General;  Laterality: N/A;  . LAPAROSCOPIC BILATERAL SALPINGO OOPHERECTOMY Bilateral 05/24/2014   Procedure: LAPAROSCOPIC BILATERAL SALPINGO OOPHORECTOMY;  Surgeon: Janyth Pupa, DO;  Location: [facility];  Service: Gynecology;  Laterality: Bilateral;  . LAPAROSCOPY     1993 - Uterus mass  . TOTAL KNEE ARTHROPLASTY Left  03/14/2013   Procedure: LEFT TOTAL KNEE ARTHROPLASTY;  Surgeon: Vickey Huger, MD;  Location: [facility];  Service: Orthopedics;  Laterality: Left;  . TOTAL KNEE ARTHROPLASTY Right 08/11/2016   Procedure: TOTAL KNEE ARTHROPLASTY;  Surgeon: Vickey Huger, MD;  Location: [facility];  Service: Orthopedics;  Laterality: Right;  . WISDOM TOOTH EXTRACTION      SOCIAL HISTORY:   Social History   Socioeconomic History  . Marital status: Married    Spouse name: Not on file  . Number of children: Not on file  . Years of education: Not on file  . Highest education level: Not on file  Social Needs  . Financial resource strain: Not on file  . Food insecurity - worry: Not on file  . Food insecurity - inability: Not on file  . Transportation needs - medical: Not on file  . Transportation needs - non-medical: Not on file  Occupational History  . Not on file  Tobacco Use  . Smoking status: Never Smoker  . Smokeless tobacco: Never Used  Substance and Sexual Activity  . Alcohol use: No  . Drug use: No  . Sexual activity: Yes    Birth control/protection: Post-menopausal  Other Topics Concern  . Not on file  Social History Narrative  . Not on file    FAMILY HISTORY:   Family Status  Relation Name Status  . Mother  Deceased  . Father  Deceased  . Sister  Alive  . Brother Social research officer, government  . Son x3 Alive  . Daughter x1 Alive    ROS:  A complete 10 system review of systems was obtained and was unremarkable apart from what is mentioned above.  PHYSICAL EXAMINATION:    VITALS:   Vitals:   04/02/17 0938  BP: 128/80  Pulse: 76  SpO2: 98%  Weight: 200 lb (90.7 kg)  Height: 5\' 10"  (1.778 m)    GEN:  The patient appears stated age and is in NAD. HEENT:  Normocephalic, atraumatic.  The mucous membranes are moist. The superficial temporal arteries are without ropiness or tenderness. CV:  RRR Lungs:  CTAB Neck/HEME:  There are no carotid bruits bilaterally.  Neurological examination:  Orientation: The  patient is alert and oriented x3. Fund of knowledge is appropriate.  Recent and remote memory are intact.  Attention and concentration are normal.    Able to name objects and repeat phrases. Cranial nerves: There is good facial symmetry. Pupils are equal round and reactive to light bilaterally. Fundoscopic exam is attempted but the disc margins are not well visualized bilaterally. Extraocular muscles are intact. The visual fields are full to confrontational testing. The speech is fluent and clear. Soft palate rises symmetrically and there is no tongue deviation. Hearing is intact to conversational tone. Sensation: Sensation is intact to light and pinprick throughout (facial, trunk, extremities). Vibration is intact at  the bilateral big toe. There is no extinction with double simultaneous stimulation. There is no sensory dermatomal level identified. Motor: Strength is 5/5 in the bilateral upper extremities.  Strength is at least 4+/5 in the right lower extremity and 5-/5 in the left lower extremity.  There is some give way weakness (pain from previous surgeries).  Shoulder shrug is equal and symmetric.  There is no pronator drift. Deep tendon reflexes: Deep tendon reflexes are 2-/4 at the bilateral biceps, triceps, brachioradialis, patella and achilles. Plantar responses are downgoing bilaterally.  Movement examination: Tone: There is normal tone in the bilateral upper extremities.  The tone in the lower extremities is normal.  Abnormal movements: There is a rest tremor noted in the left upper extremity.  There is mild intention tremor bilaterally.  There is jaw tremor.  She is able to easily pour water from one glass to another without spilling it.  Archimedes spirals are drawn well. Coordination:  There is no decremation with RAM's, with any form of RAMS, including alternating supination and pronation of the forearm, hand opening and closing, finger taps, heel taps and toe taps. Gait and Station: The  patient has no difficulty arising out of a deep-seated chair without the use of the hands. The patient's stride length is normal, without re-emergent tremor.      Chemistry      Component Value Date/Time   NA 137 08/12/2016 0445   K 4.3 08/12/2016 0445   CL 107 08/12/2016 0445   CO2 23 08/12/2016 0445   BUN 8 08/12/2016 0445   CREATININE 0.72 08/12/2016 0445      Component Value Date/Time   CALCIUM 9.0 08/12/2016 0445   ALKPHOS 78 08/01/2016 1010   AST 23 08/01/2016 1010   ALT 23 08/01/2016 1010   BILITOT 0.5 08/01/2016 1010     No results found for: TSH   ASSESSMENT/PLAN:  1.  Tremor  -she has what sounds like many years of essential tremor.  She now has jaw tremor and rest tremor on the L.  We talked about various medications and interventions, but ultimately she does not want any.  We discussed that medications generally do not help jaw tremor, although surgical management could.  If tremor is more bothersome, we can certainly address medications again in the future.  I did discuss with her that I would like to follow her in the future, primarily because she has developed a rest tremor on the left.  Otherwise, she does not meet any of the [place] brain bank criteria for idiopathic Parkinson's disease.  -called PCP to get TSH and none has been done.  We will order.  2.  I will plan on seeing her back in 6-8 months, sooner should new neurologic issues arise.  Greater than 50% of the 45-minute visit was spent in counseling with patient and her husband discussing what to expect now and in the future.  Also discussed that she needs to follow-up earlier if any new neurologic symptoms develop.  Cc:  Lawerance Cruel, MD

## 2017-04-02 ENCOUNTER — Encounter: Payer: Self-pay | Admitting: Neurology

## 2017-04-02 ENCOUNTER — Other Ambulatory Visit: Payer: Medicare Other

## 2017-04-02 ENCOUNTER — Ambulatory Visit: Payer: Medicare Other | Admitting: Neurology

## 2017-04-02 VITALS — BP 128/80 | HR 76 | Ht 70.0 in | Wt 200.0 lb

## 2017-04-02 DIAGNOSIS — R251 Tremor, unspecified: Secondary | ICD-10-CM

## 2017-04-02 DIAGNOSIS — G250 Essential tremor: Secondary | ICD-10-CM

## 2017-04-02 NOTE — Patient Instructions (Signed)
1. Your provider has requested that you have labwork completed today. Please go to [facility] ([id number]) on the second floor of this building before leaving the office today. You do not need to check in. If you are not called within 15 minutes please check with the front desk.   

## 2017-04-03 ENCOUNTER — Telehealth: Payer: Self-pay | Admitting: Neurology

## 2017-04-03 LAB — TSH: TSH: 2.33 m[IU]/L (ref 0.40–4.50)

## 2017-04-03 NOTE — Telephone Encounter (Signed)
-----   Message from Gerrard, DO sent at 04/03/2017  7:34 AM EST ----- I have reviewed all lab results which are normal or stable. Please inform the patient.

## 2017-04-03 NOTE — Telephone Encounter (Signed)
Patient made aware lab work okay.

## 2017-10-01 NOTE — Progress Notes (Signed)
Veronica Phelps was seen today in the movement disorders clinic for neurologic consultation at the request of Lawerance Cruel, MD.  The consultation is for the evaluation of jaw and chin tremor. The records that were made available to me were reviewed.  This patient is accompanied in the office by her spouse who supplements the history.  Tremor started 1 year ago in the chin but she has had had tremor for 10 years.  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  Knee replacement was done last year with some complications and chin tremor was more pronounced then  2017-10-06 update:  Pt seen in f/u for tremor.   Tremor has been stable since our last visit.  "I have been fine and stable."  Husband tells her she has been stable as well.  On no meds.  One fall since our last visit.  They were at [facility] and running to see a character and she got tripped by a child.  She hit her head.    PREVIOUS MEDICATIONS: none to date  ALLERGIES:   Allergies  Allergen Reactions  . Propoxyphene Other (See Comments)    fainting  . Ultram [Tramadol] Other (See Comments)    Dizziness, and headaches  . Oxycontin [Oxycodone Hcl] Other (See Comments)    Dizziness    CURRENT MEDICATIONS:  Outpatient Encounter Medications as of 10/05/2017  Medication Sig  . Probiotic Product (PROBIOTIC-10 PO) Take by mouth.   No facility-administered encounter medications on file as of 10/05/2017.     PAST MEDICAL HISTORY:   Past Medical History:  Diagnosis Date  . Anemia   . Arthritis    knees - no meds  . Bilateral ovarian cysts   . Complication of anesthesia    takes awhile to wake up.  Also see my note about her vocal cords  . PONV (postoperative nausea and vomiting)   . SVD (spontaneous vaginal delivery)    x 4    PAST SURGICAL HISTORY:   Past Surgical History:  Procedure Laterality Date  . APPENDECTOMY    . BLEPHAROPLASTY    .  CATARACT EXTRACTION, BILATERAL    . HERNIA REPAIR     1970s x2 -33 y/o  . KNEE ARTHROSCOPY Left 06/12/2014   Procedure: LEFT KNEE ARTHROSCOPY ;  Surgeon: Vickey Huger, MD;  Location: [place];  Service: Orthopedics;  Laterality: Left;  . KNEE ARTHROSCOPY W/ MENISCAL REPAIR     left    11/2011  . LAPAROSCOPIC APPENDECTOMY N/A 03/13/2014   Procedure: APPENDECTOMY LAPAROSCOPIC;  Surgeon: Jackolyn Confer, MD;  Location: [facility];  Service: General;  Laterality: N/A;  . LAPAROSCOPIC BILATERAL SALPINGO OOPHERECTOMY Bilateral 05/24/2014   Procedure: LAPAROSCOPIC BILATERAL SALPINGO OOPHORECTOMY;  Surgeon: Janyth Pupa, DO;  Location: [facility];  Service: Gynecology;  Laterality: Bilateral;  . LAPAROSCOPY     1993 - Uterus mass  . TOTAL KNEE ARTHROPLASTY Left 03/14/2013   Procedure: LEFT TOTAL KNEE ARTHROPLASTY;  Surgeon: Vickey Huger, MD;  Location: [facility];  Service: Orthopedics;  Laterality: Left;  . TOTAL KNEE ARTHROPLASTY Right 08/11/2016   Procedure: TOTAL KNEE ARTHROPLASTY;  Surgeon: Vickey Huger, MD;  Location: [facility];  Service: Orthopedics;  Laterality: Right;  . WISDOM TOOTH EXTRACTION      SOCIAL HISTORY:   Social History   Socioeconomic History  . Marital status: Married    Spouse name: Not on file  .  Number of children: Not on file  . Years of education: Not on file  . Highest education level: Not on file  Occupational History  . Occupation: retired    Comment: Pharmacist, hospital  Social Needs  . Financial resource strain: Not on file  . Food insecurity:    Worry: Not on file    Inability: Not on file  . Transportation needs:    Medical: Not on file    Non-medical: Not on file  Tobacco Use  . Smoking status: Never Smoker  . Smokeless tobacco: Never Used  Substance and Sexual Activity  . Alcohol use: No  . Drug use: No  . Sexual activity: Yes    Birth control/protection: Post-menopausal  Lifestyle  . Physical activity:    Days per week: Not on file    Minutes per session: Not on file  .  Stress: Not on file  Relationships  . Social connections:    Talks on phone: Not on file    Gets together: Not on file    Attends religious service: Not on file    Active member of club or organization: Not on file    Attends meetings of clubs or organizations: Not on file    Relationship status: Not on file  . Intimate partner violence:    Fear of current or ex partner: Not on file    Emotionally abused: Not on file    Physically abused: Not on file    Forced sexual activity: Not on file  Other Topics Concern  . Not on file  Social History Narrative  . Not on file    FAMILY HISTORY:   Family Status  Relation Name Status  . Mother  Deceased  . Father  Deceased  . Sister  Alive  . Brother Social research officer, government  . Son x3 Alive  . Daughter x1 Alive    ROS:  Review of Systems  Constitutional: Negative.   HENT: Negative.   Eyes: Negative.   Respiratory: Negative.   Cardiovascular: Negative.   Gastrointestinal: Negative.   Skin: Negative.      PHYSICAL EXAMINATION:    VITALS:   Vitals:   10/05/17 0839  BP: 100/66  Pulse: 74  SpO2: 96%  Weight: 201 lb (91.2 kg)  Height: 5\' 10"  (1.778 m)    GEN:  The patient appears stated age and is in NAD. HEENT:  Normocephalic, atraumatic.  The mucous membranes are moist. The superficial temporal arteries are without ropiness or tenderness. CV:  RRR Lungs:  CTAB Neck/HEME:  There are no carotid bruits bilaterally.  Neurological examination:  Orientation: The patient is alert and oriented x3. Cranial nerves: There is good facial symmetry. The speech is fluent and clear. Soft palate rises symmetrically and there is no tongue deviation. Hearing is intact to conversational tone. Sensation: Sensation is intact to light touch throughout Motor: Strength is 5/5 in the bilateral upper and lower extremities.   Shoulder shrug is equal and symmetric.  There is no pronator drift.   Movement examination: Tone: There is normal tone in the  bilateral upper extremities.  The tone in the lower extremities is normal.  Abnormal movements: There is LUE resting tremor.  she has jaw tremor that is intermittent but worse with distraction Coordination:  There is no decremation with RAM's, with any form of RAMS, including alternating supination and pronation of the forearm, hand opening and closing, finger taps, heel taps and toe taps. Gait and Station: The patient has no difficulty arising  out of a deep-seated chair without the use of the hands. The patient's stride length is normal, without re-emergent tremor.      Chemistry      Component Value Date/Time   NA 137 08/12/2016 0445   K 4.3 08/12/2016 0445   CL 107 08/12/2016 0445   CO2 23 08/12/2016 0445   BUN 8 08/12/2016 0445   CREATININE 0.72 08/12/2016 0445      Component Value Date/Time   CALCIUM 9.0 08/12/2016 0445   ALKPHOS 78 08/01/2016 1010   AST 23 08/01/2016 1010   ALT 23 08/01/2016 1010   BILITOT 0.5 08/01/2016 1010     Lab Results  Component Value Date   TSH 2.33 04/02/2017     ASSESSMENT/PLAN:  1.  Tremor  -she has what sounds like many years of essential tremor.  She now has jaw tremor and rest tremor on the L.  She feels confident that she has been stable and doesn't wish to make regular f/u.  She will contact me with new or worsening neuro s/s.  Discussed what these may be.    2.  F/u prn   Cc:  Lawerance Cruel, MD

## 2017-10-05 ENCOUNTER — Ambulatory Visit: Payer: Medicare Other | Admitting: Neurology

## 2017-10-05 ENCOUNTER — Encounter: Payer: Self-pay | Admitting: Neurology

## 2017-10-05 VITALS — BP 100/66 | HR 74 | Ht 70.0 in | Wt 201.0 lb

## 2017-10-05 DIAGNOSIS — G250 Essential tremor: Secondary | ICD-10-CM

## 2018-10-09 ENCOUNTER — Emergency Department (HOSPITAL_COMMUNITY)
Admission: EM | Admit: 2018-10-09 | Discharge: 2018-10-09 | Disposition: A | Discharge status: Home / Self Care | Payer: Medicare Other | Attending: Emergency Medicine | Admitting: Emergency Medicine

## 2018-10-09 ENCOUNTER — Encounter (HOSPITAL_COMMUNITY): Payer: Self-pay | Admitting: *Deleted

## 2018-10-09 ENCOUNTER — Emergency Department (HOSPITAL_COMMUNITY): Payer: Medicare Other

## 2018-10-09 ENCOUNTER — Other Ambulatory Visit: Payer: Self-pay

## 2018-10-09 DIAGNOSIS — R1032 Left lower quadrant pain: Secondary | ICD-10-CM | POA: Diagnosis present

## 2018-10-09 DIAGNOSIS — Z79899 Other long term (current) drug therapy: Secondary | ICD-10-CM | POA: Insufficient documentation

## 2018-10-09 DIAGNOSIS — N200 Calculus of kidney: Secondary | ICD-10-CM | POA: Insufficient documentation

## 2018-10-09 LAB — CBC WITH DIFFERENTIAL/PLATELET
Abs Immature Granulocytes: 0.03 10*3/uL (ref 0.00–0.07)
Basophils Absolute: 0 10*3/uL (ref 0.0–0.1)
Basophils Relative: 0 %
Eosinophils Absolute: 0.1 10*3/uL (ref 0.0–0.5)
Eosinophils Relative: 1 %
HCT: 43.9 % (ref 36.0–46.0)
Hemoglobin: 14.4 g/dL (ref 12.0–15.0)
Immature Granulocytes: 0 %
Lymphocytes Relative: 15 %
Lymphs Abs: 1.4 10*3/uL (ref 0.7–4.0)
MCH: 31.3 pg (ref 26.0–34.0)
MCHC: 32.8 g/dL (ref 30.0–36.0)
MCV: 95.4 fL (ref 80.0–100.0)
Monocytes Absolute: 0.5 10*3/uL (ref 0.1–1.0)
Monocytes Relative: 5 %
Neutro Abs: 7.4 10*3/uL (ref 1.7–7.7)
Neutrophils Relative %: 79 %
Platelets: 266 10*3/uL (ref 150–400)
RBC: 4.6 MIL/uL (ref 3.87–5.11)
RDW: 13.2 % (ref 11.5–15.5)
WBC: 9.5 10*3/uL (ref 4.0–10.5)
nRBC: 0 % (ref 0.0–0.2)

## 2018-10-09 LAB — URINALYSIS, ROUTINE W REFLEX MICROSCOPIC
Bacteria, UA: NONE SEEN
Bilirubin Urine: NEGATIVE
Glucose, UA: NEGATIVE mg/dL
Ketones, ur: 5 mg/dL — AB
Leukocytes,Ua: NEGATIVE
Nitrite: NEGATIVE
Protein, ur: NEGATIVE mg/dL
RBC / HPF: >50 RBC/hpf — ABNORMAL HIGH (ref 0–5)
Specific Gravity, Urine: 1.042 — ABNORMAL HIGH (ref 1.005–1.030)
pH: 7 (ref 5.0–8.0)

## 2018-10-09 LAB — COMPREHENSIVE METABOLIC PANEL
ALT: 35 U/L (ref 0–44)
AST: 31 U/L (ref 15–41)
Albumin: 4.2 g/dL (ref 3.5–5.0)
Alkaline Phosphatase: 94 U/L (ref 38–126)
Anion gap: 14 (ref 5–15)
BUN: 23 mg/dL (ref 8–23)
CO2: 22 mmol/L (ref 22–32)
Calcium: 9.4 mg/dL (ref 8.9–10.3)
Chloride: 104 mmol/L (ref 98–111)
Creatinine, Ser: 0.9 mg/dL (ref 0.44–1.00)
GFR calc Af Amer: >60 mL/min (ref >60)
GFR calc non Af Amer: >60 mL/min (ref >60)
Glucose, Bld: 129 mg/dL — ABNORMAL HIGH (ref 70–99)
Potassium: 4.2 mmol/L (ref 3.5–5.1)
Sodium: 140 mmol/L (ref 135–145)
Total Bilirubin: 0.5 mg/dL (ref 0.3–1.2)
Total Protein: 7.2 g/dL (ref 6.5–8.1)

## 2018-10-09 LAB — LIPASE, BLOOD: Lipase: 38 U/L (ref 11–51)

## 2018-10-09 MED ORDER — FENTANYL CITRATE (PF) 100 MCG/2ML IJ SOLN
50.0000 ug | Freq: Once | INTRAMUSCULAR | Status: AC
  Administered 2018-10-09: 08:00:00 50 ug via INTRAVENOUS
  Filled 2018-10-09: qty 2

## 2018-10-09 MED ORDER — SODIUM CHLORIDE 0.9 % IV SOLN
Freq: Once | INTRAVENOUS | Status: AC
  Administered 2018-10-09: 08:00:00 via INTRAVENOUS

## 2018-10-09 MED ORDER — ONDANSETRON HCL 4 MG/2ML IJ SOLN
4.0000 mg | Freq: Once | INTRAMUSCULAR | Status: AC
  Administered 2018-10-09: 08:00:00 4 mg via INTRAVENOUS
  Filled 2018-10-09: qty 2

## 2018-10-09 MED ORDER — IOHEXOL 300 MG/ML  SOLN
100.0000 mL | Freq: Once | INTRAMUSCULAR | Status: AC | PRN
  Administered 2018-10-09: 100 mL via INTRAVENOUS

## 2018-10-09 MED ORDER — MORPHINE SULFATE 15 MG PO TABS: 7.5000 mg | ORAL_TABLET | ORAL | 0 refills | Status: DC | PRN

## 2018-10-09 MED ORDER — SODIUM CHLORIDE (PF) 0.9 % IJ SOLN
INTRAMUSCULAR | Status: AC
  Administered 2018-10-09: 10:00:00
  Filled 2018-10-09: qty 50

## 2018-10-09 MED ORDER — KETOROLAC TROMETHAMINE 15 MG/ML IJ SOLN
15.0000 mg | Freq: Once | INTRAMUSCULAR | Status: AC
  Administered 2018-10-09: 15 mg via INTRAVENOUS
  Filled 2018-10-09: qty 1

## 2018-10-09 MED ORDER — SODIUM CHLORIDE 0.9% FLUSH
3.0000 mL | Freq: Once | INTRAVENOUS | Status: AC
  Administered 2018-10-09: 08:00:00 3 mL via INTRAVENOUS

## 2018-10-09 MED ORDER — TAMSULOSIN HCL 0.4 MG PO CAPS: 0.4000 mg | ORAL_CAPSULE | Freq: Every day | ORAL | 0 refills | Status: DC

## 2018-10-09 MED ORDER — ONDANSETRON 4 MG PO TBDP: 4.0000 mg | ORAL_TABLET | Freq: Three times a day (TID) | ORAL | 0 refills | Status: DC | PRN

## 2018-10-09 NOTE — ED Triage Notes (Signed)
Left lower abdominal pain, started yesterday, worsening this morning. Vomiting. "loose stools". Denies fevers.

## 2018-10-09 NOTE — ED Provider Notes (Signed)
[facility] DEPT Provider Note   CSN: [id number] Arrival date & time: 10/09/18  [id number]     History   Chief Complaint Chief Complaint  Patient presents with   Abdominal Pain    HPI Veronica Phelps is a 73 y.o. female.     The history is provided by the patient and medical records. No language interpreter was used.  Abdominal Pain Associated symptoms: diarrhea (Loose stools), nausea and vomiting   Associated symptoms: no constipation    Veronica Phelps is a 73 y.o. female  with a PMH as listed below who presents to the Emergency Department complaining of left-sided abdominal pain.  Patient states that last night, she felt a little queasy and unwell, but denies any abdominal pain at that time.  She awoke this morning around 4 AM with severe, sharp left lower quadrant pain.  Does not radiate.  Associated with nausea and 2 or 3 episodes of emesis.  She reports 2 or 3 looser stools than normal, but does not feel as if it was diarrhea.  No blood in her stool.  No history of similar.  No recent antibiotic use.  No urinary symptoms.  No fever.  Has a history of appendectomy, bilateral oophorectomy.  No medications prior to arrival for symptoms.  Past Medical History:  Diagnosis Date   Anemia    Arthritis    knees - no meds   Bilateral ovarian cysts    Complication of anesthesia    takes awhile to wake up.  Also see my note about her vocal cords   PONV (postoperative nausea and vomiting)    SVD (spontaneous vaginal delivery)    x 4    Patient Active Problem List   Diagnosis Date Noted   S/P total knee replacement 08/11/2016   Dermoid cyst of right ovary 03/14/2014   Mass of uterus 03/14/2014   S/P appy 03/14/2014   Appendicitis 03/13/2014   Acute appendicitis 03/13/2014   S/P total knee arthroplasty 03/14/2013    Past Surgical History:  Procedure Laterality Date   APPENDECTOMY     BLEPHAROPLASTY     CATARACT EXTRACTION,  BILATERAL     HERNIA REPAIR     1970s x2 -89 y/o   KNEE ARTHROSCOPY Left 06/12/2014   Procedure: LEFT KNEE ARTHROSCOPY ;  Surgeon: Vickey Huger, MD;  Location: [facility];  Service: Orthopedics;  Laterality: Left;   KNEE ARTHROSCOPY W/ MENISCAL REPAIR     left    11/2011   LAPAROSCOPIC APPENDECTOMY N/A 03/13/2014   Procedure: APPENDECTOMY LAPAROSCOPIC;  Surgeon: Jackolyn Confer, MD;  Location: [facility];  Service: General;  Laterality: N/A;   LAPAROSCOPIC BILATERAL SALPINGO OOPHERECTOMY Bilateral 05/24/2014   Procedure: LAPAROSCOPIC BILATERAL SALPINGO OOPHORECTOMY;  Surgeon: Janyth Pupa, DO;  Location: [facility];  Service: Gynecology;  Laterality: Bilateral;   LAPAROSCOPY     1993 - Uterus mass   TOTAL KNEE ARTHROPLASTY Left 03/14/2013   Procedure: LEFT TOTAL KNEE ARTHROPLASTY;  Surgeon: Vickey Huger, MD;  Location: [facility];  Service: Orthopedics;  Laterality: Left;   TOTAL KNEE ARTHROPLASTY Right 08/11/2016   Procedure: TOTAL KNEE ARTHROPLASTY;  Surgeon: Vickey Huger, MD;  Location: [facility];  Service: Orthopedics;  Laterality: Right;   WISDOM TOOTH EXTRACTION       OB History   No obstetric history on file.      Home Medications    Prior to Admission medications   Medication Sig Start Date End Date Taking? Authorizing Provider  cholestyramine (  QUESTRAN) 4 g packet Take 4 g by mouth daily.   Yes [provider]  morphine (MSIR) 15 MG tablet Take 0.5 tablets (7.5 mg total) by mouth every 4 (four) hours as needed for severe pain. 10/09/18   Luqman Perrelli, Ozella Almond, PA-C  ondansetron (ZOFRAN ODT) 4 MG disintegrating tablet Take 1 tablet (4 mg total) by mouth every 8 (eight) hours as needed for nausea or vomiting. 10/09/18   Lashonda Sonneborn, Ozella Almond, PA-C  tamsulosin (FLOMAX) 0.4 MG CAPS capsule Take 1 capsule (0.4 mg total) by mouth daily. 10/09/18   Billiejo Sorto, Ozella Almond, PA-C    Family History Family History  Problem Relation Age of Onset   Cancer Mother    Dementia Father    Cerebral  palsy Sister    Diabetes Mellitus II Brother    Crohn's disease Son     Social History Social History   Tobacco Use   Smoking status: Never Smoker   Smokeless tobacco: Never Used  Substance Use Topics   Alcohol use: No   Drug use: No     Allergies   Propoxyphene, Ultram [tramadol], and Oxycontin [oxycodone hcl]   Review of Systems Review of Systems  Gastrointestinal: Positive for abdominal pain, diarrhea (Loose stools), nausea and vomiting. Negative for blood in stool and constipation.  All other systems reviewed and are negative.    Physical Exam Updated Vital Signs BP (!) 141/58    Pulse 76    Temp 97.6 F (36.4 C) (Oral)    Resp 18    SpO2 99%   Physical Exam Vitals signs and nursing note reviewed.  Constitutional:      General: She is not in acute distress.    Appearance: She is well-developed.     Comments: Uncomfortable appearing.  HENT:     Head: Normocephalic and atraumatic.  Neck:     Musculoskeletal: Neck supple.  Cardiovascular:     Rate and Rhythm: Normal rate and regular rhythm.     Heart sounds: Normal heart sounds. No murmur.  Pulmonary:     Effort: Pulmonary effort is normal. No respiratory distress.     Breath sounds: Normal breath sounds.  Abdominal:     General: There is no distension.     Palpations: Abdomen is soft.     Comments: Tenderness to the left lower quadrant.  No CVA tenderness.  Skin:    General: Skin is warm and dry.  Neurological:     Mental Status: She is alert and oriented to person, place, and time.      ED Treatments / Results  Labs (all labs ordered are listed, but only abnormal results are displayed) Labs Reviewed  COMPREHENSIVE METABOLIC PANEL - Abnormal; Notable for the following components:      Result Value   Glucose, Bld 129 (*)    All other components within normal limits  URINALYSIS, ROUTINE W REFLEX MICROSCOPIC - Abnormal; Notable for the following components:   Specific Gravity, Urine 1.042 (*)     Hgb urine dipstick LARGE (*)    Ketones, ur 5 (*)    RBC / HPF >50 (*)    All other components within normal limits  URINE CULTURE  LIPASE, BLOOD  CBC WITH DIFFERENTIAL/PLATELET    EKG None  Radiology Ct Abdomen Pelvis W Contrast  Result Date: 10/09/2018 CLINICAL DATA:  73 year old female with history of left lower quadrant abdominal pain with nausea and vomiting. EXAM: CT ABDOMEN AND PELVIS WITH CONTRAST TECHNIQUE: Multidetector CT imaging of the  abdomen and pelvis was performed using the standard protocol following bolus administration of intravenous contrast. CONTRAST:  139mL OMNIPAQUE IOHEXOL 300 MG/ML  SOLN COMPARISON:  CT the abdomen and pelvis 03/13/2014. FINDINGS: Lower chest: Areas of scarring or atelectasis in the lung bases bilaterally. Hepatobiliary: No suspicious cystic or solid hepatic lesions are noted in the visualized portions of the liver (superior aspect of the right lobe of the liver is incompletely imaged). No intra or extrahepatic biliary ductal dilatation. Tiny calcified gallstones lying dependently in the gallbladder. No findings to suggest an acute cholecystitis at this time. Pancreas: No pancreatic mass. No pancreatic ductal dilatation. No pancreatic or peripancreatic fluid collections or inflammatory changes. Spleen: Unremarkable. Adrenals/Urinary Tract: 7 mm nonobstructive calculus in the interpolar collecting system of the left kidney. 6 mm calculus also noted at the left ureteropelvic junction (coronal image 73 of series 6) with mild proximal left hydronephrosis. Right kidney and bilateral adrenal glands are normal in appearance. No right hydroureteronephrosis. Urinary bladder is normal in appearance. Stomach/Bowel: Normal appearance of the stomach. No pathologic dilatation of small bowel or colon. Numerous colonic diverticulae are noted, particularly in the sigmoid colon, without surrounding inflammatory changes to suggest an acute diverticulitis at this time. Status  post appendectomy. Vascular/Lymphatic: Aortic atherosclerosis, without evidence of aneurysm or dissection in the abdominal or pelvic vasculature. No lymphadenopathy noted in the abdomen or pelvis. Reproductive: Uterus and ovaries are unremarkable in appearance. Other: No significant volume of ascites.  No pneumoperitoneum. Musculoskeletal: There are no aggressive appearing lytic or blastic lesions noted in the visualized portions of the skeleton. IMPRESSION: 1. 6 mm calculus at the left ureteropelvic junction with mild left hydronephrosis. There is also a 7 mm nonobstructive calculus in the interpolar collecting system of left kidney. 2. Colonic diverticulosis without evidence of acute diverticulitis at this time. 3. Cholelithiasis without evidence of acute cholecystitis at this time. 4. Aortic atherosclerosis. Electronically Signed   By: Vinnie Langton M.D.   On: 10/09/2018 10:01    Procedures Procedures (including critical care time)  Medications Ordered in ED Medications  sodium chloride flush (NS) 0.9 % injection 3 mL (3 mLs Intravenous Given 10/09/18 0742)  ondansetron (ZOFRAN) injection 4 mg (4 mg Intravenous Given 10/09/18 0743)  fentaNYL (SUBLIMAZE) injection 50 mcg (50 mcg Intravenous Given 10/09/18 0744)  0.9 %  sodium chloride infusion ( Intravenous New Bag/Given 10/09/18 0742)  iohexol (OMNIPAQUE) 300 MG/ML solution 100 mL (100 mLs Intravenous Contrast Given 10/09/18 0916)  sodium chloride (PF) 0.9 % injection (  Given by Other 10/09/18 0933)  ketorolac (TORADOL) 15 MG/ML injection 15 mg (15 mg Intravenous Given 10/09/18 1048)     Initial Impression / Assessment and Plan / ED Course  I have reviewed the triage vital signs and the nursing notes.  Pertinent labs & imaging results that were available during my care of the patient were reviewed by me and considered in my medical decision making (see chart for details).       Veronica Phelps is a 73 y.o. female who presents to ED for  acute onset of left sided abdominal pain which began last night/early this morning.  On exam, patient appears quite uncomfortable, actively vomiting and has tenderness to the left lower quadrant of her abdomen.  Afebrile and dynamically stable.  Labs reviewed and reassuring including normal white count and kidney function.  Urinalysis with blood, but no evidence of infection.  CT shows 6 mm stone at the left UPJ with mild hydro.  Patient reevaluated  after symptomatic medications and feels much improved.  She would like to go home if possible.  Discussed home care instructions, urology follow-up and return precautions with patient and family member. Dr. Tyrone Nine discussed this as well. All questions answered.   Patient seen by and discussed with Dr. Tyrone Nine who agrees with treatment plan.   Final Clinical Impressions(s) / ED Diagnoses   Final diagnoses:  Kidney stone    ED Discharge Orders         Ordered    morphine (MSIR) 15 MG tablet  Every 4 hours PRN     10/09/18 1143    ondansetron (ZOFRAN ODT) 4 MG disintegrating tablet  Every 8 hours PRN     10/09/18 1143    tamsulosin (FLOMAX) 0.4 MG CAPS capsule  Daily     10/09/18 [place], Ozella Almond, PA-C 10/09/18 [place], Dan, DO 10/09/18 1225

## 2018-10-09 NOTE — Discharge Instructions (Signed)
It was my pleasure taking care of you today!   You have been diagnosed with kidney stones. Drink plenty of fluids to help you pass the stone.  Take 600mg  ibuprofen every 6-8 hours with food for mild to moderate pain. 1000mg  Tylenol every 8 hours. Use your pain medication as directed as needed for severe pain. Taking flomax as directed will also help to pass the stone. Use Zofran for nausea as directed.  Follow up with the urology clinic listed in regards to your hospital visit.   Return to the ED immediately if you develop fever, uncontrolled pain or vomiting, new or worsening symptoms, any additional concerns.

## 2018-10-10 LAB — URINE CULTURE

## 2019-02-04 ENCOUNTER — Ambulatory Visit: Payer: Medicare PPO | Attending: Internal Medicine

## 2019-02-04 DIAGNOSIS — Z20822 Contact with and (suspected) exposure to covid-19: Secondary | ICD-10-CM | POA: Insufficient documentation

## 2019-02-06 LAB — NOVEL CORONAVIRUS, NAA: SARS-CoV-2, NAA: NOT DETECTED

## 2019-03-29 ENCOUNTER — Encounter (HOSPITAL_BASED_OUTPATIENT_CLINIC_OR_DEPARTMENT_OTHER): Payer: Self-pay | Admitting: Urology

## 2019-03-29 ENCOUNTER — Other Ambulatory Visit: Payer: Self-pay

## 2019-03-29 ENCOUNTER — Other Ambulatory Visit (HOSPITAL_COMMUNITY)
Admission: RE | Admit: 2019-03-29 | Discharge: 2019-03-29 | Disposition: A | Discharge status: Home / Self Care | Payer: Medicare PPO | Source: Ambulatory Visit | Attending: Urology | Admitting: Urology

## 2019-03-29 ENCOUNTER — Other Ambulatory Visit: Payer: Self-pay | Admitting: Urology

## 2019-03-29 DIAGNOSIS — Z20822 Contact with and (suspected) exposure to covid-19: Secondary | ICD-10-CM | POA: Insufficient documentation

## 2019-03-29 DIAGNOSIS — Z01812 Encounter for preprocedural laboratory examination: Secondary | ICD-10-CM | POA: Diagnosis present

## 2019-03-29 LAB — SARS CORONAVIRUS 2 (TAT 6-24 HRS): SARS Coronavirus 2: NEGATIVE

## 2019-03-29 NOTE — Progress Notes (Signed)
Spoke w/ via phone for pre-op interview--- PT Lab needs dos----    KUB           Lab results------ no COVID test ------ 03-29-2019 @1340  Arrive at ------- 0700 NPO after ------ MN Medications to take morning of surgery ----- NONE Diabetic medication ----- n/a Patient Special Instructions ----- n/a Pre-Op special Istructions ----- n/a Patient verbalized understanding of instructions that were given at this phone interview. Patient denies shortness of breath, chest pain, fever, cough a this phone interview.

## 2019-03-30 NOTE — H&P (Signed)
HPI: Veronica Phelps is a 74 year-old female ewith a left ureteral calculus.  The patient's stone was on her left side. She first noticed the symptoms 12/29/2018. This is not her first kidney stone. She does not have a burning sensation when she urinates.   She has never had surgical treatment for calculi in the past.   10/14/18: She was seen in the ER on 10/09/18 with left-sided abdominal pain that had begun the night previously. Was associated with some nausea and emesis. She denied any urinary symptoms. She was found by CT scan to have what the radiologist read as a 6 mm stone however this is the length of the stone where as its width was 4.5 mm and was located at the UPJ on the left-hand side with Hounsfield units of 700. There was a 7 mm left renal calculus as well with no right renal calculi.  After she was seen in the ER and started on tamsulosin she said she was urinating the other day and heard something hit the toilet. She has been pain-free since. She said the tamsulosin causes headache. He has no prior history of stones and there is no family history of stones.   12/29/2018: KUB last office visit did not identify any obvious ureteral calculi, stable nonobstructing left renal calculi was identified. Based on her presentation, HPI and cessation of symptoms it was determined patient had likely passed the ureteral calculi previously identified on CT imaging from September. Approximately 2 weeks ago she had recurrence of left lower quadrant abdominal and flank pain. At that time she took pain medication and tamsulosin prescribed at the time of her emergency department presentation in September with eventual resolution of pain/discomfort after approximately 1 day.   Last night she noted recurrence of pain in the left lower flank/left lower quadrant of her abdomen described as sharp, nonradiating. Only minimally improved with use of Tylenol. Not associated with any increased frequency/urgency, burning or  painful urination, change in force of stream. She has not seen visible blood in her urine or noted any interval passage of stone material. She denies any constipation or other issues with bowel movements. Previous CT imaging did note some evidence of diverticular disease but no diverticulitis. She denies a past history of such. Denies fevers or chills, nausea/vomiting   01/07/2019: KUB was inconclusive at last OV. I offered CT imaging but pt declined. I had her resume tamsulosin in the event she was trying to pass a ureteral calculi. UA without hematuria, c/s was negative.   Pt presents with resolution of symptoms. She states feeling something pass in her urine stream over the past week with complete resolution of symptoms. She was unable to directly visualize or capture any stone material. Since then her pain has completely resolved. She continues to void at her baseline without bothersome frequency/urgency, dysuria, visible blood in the urine. She denies interval f/c, n/v.   Her left UVJ stone had Hounsfield units of 700.   02/01/19: She came in today having developed what she describes as severe left lower quadrant pain on 01/20/19. That then resolved but recurred again today. It has persisted. It is not associated with any change in her voiding pattern. She said she started taking tamsulosin went began and she said it always causes her bowels to become a little looser but otherwise she has not had any change in her bowel habits. No nausea or vomiting and no fever.   02/09/19: She returns today for follow-up of a left  distal ureteral stone. When I saw her last we discussed medical expulsive therapy and this was undertaken with an increased dosage of tamsulosin to 0.8 mg. She said this resulted in a tingling sensation in her lower body that progressed to the point where she said was actually painful. She stop taking the medication and this went away.   03/29/19: She has returned to see me today because she  said a week ago she had pain in her left side again. She did not see any hematuria. She said she has developed some pressure in the suprapubic region with associated new frequency and urgency. No dysuria.     ALLERGIES: Darvocet Darvon oxycodone Oxycontin propoxyphene Tramadol Ultram    MEDICATIONS: Cholestermine  Vitamin D3 1,250 mcg (50,000 unit) capsule     GU PSH: None   NON-GU PSH: Anesth, Blepharoplasty Appendectomy (laparoscopic) - about 2016 Cataract surgery, Bilateral Dental Surgery Procedure Hernia Repair Knee Arthroscopy, Left Salpingo Oophorectomy, Bilateral Total Knee Replacement     GU PMH: Ureteral calculus, Her left ureteral calculus appears to have passed with no stone seen on the KUB along the course of her left ureter and no red cells in her urine. - 02/09/2019, - 01/07/2019, Left, The stone that was present in her left ureter is not visible on her KUB today. In addition her urine is clear and she did hear what sounded like something hitting the toilet the other day. I think taking all these factors together as well as the fact that she has been asymptomatic would indicate that she has passed her stone., - Nov 08, 2018 Flank Pain - 12/29/2018 Renal calculus, Left, She has a single left renal calculus. I have supplied her with information on stone prevention and will have her return in 6 months for a KUB to assure stability. - November 08, 2018    NON-GU PMH: None   FAMILY HISTORY: Colon Cancer - Mother   SOCIAL HISTORY: None   REVIEW OF SYSTEMS:    GU Review Female:   Patient denies frequent urination, hard to postpone urination, burning /pain with urination, get up at night to urinate, leakage of urine, stream starts and stops, trouble starting your stream, have to strain to urinate, and being pregnant.  Gastrointestinal (Upper):   Patient denies nausea, vomiting, and indigestion/ heartburn.  Gastrointestinal (Lower):   Patient denies diarrhea and constipation.   Constitutional:   Patient denies fever, night sweats, weight loss, and fatigue.  Skin:   Patient denies skin rash/ lesion and itching.  Eyes:   Patient denies blurred vision and double vision.  Ears/ Nose/ Throat:   Patient denies sore throat and sinus problems.  Hematologic/Lymphatic:   Patient denies swollen glands and easy bruising.  Cardiovascular:   Patient denies leg swelling and chest pains.  Respiratory:   Patient denies cough and shortness of breath.  Endocrine:   Patient denies excessive thirst.  Musculoskeletal:   Patient denies back pain and joint pain.  Neurological:   Patient denies headaches and dizziness.  Psychologic:   Patient denies depression and anxiety.   VITAL SIGNS:    Weight 200 lb / 90.72 kg  Height 70 in / 177.8 cm  BP 114/69 mmHg  Pulse 70 /min  Temperature 97.3 F / 36.2 C  BMI 28.7 kg/m   MULTI-SYSTEM PHYSICAL EXAMINATION:    Constitutional: Well-nourished. No physical deformities. Normally developed. Good grooming.  Neck: Neck symmetrical, not swollen. Normal tracheal position.  Respiratory: No labored breathing, no use of accessory muscles.   Cardiovascular:  Normal temperature, normal extremity pulses, no swelling, no varicosities.  Lymphatic: No enlargement of neck, axillae, groin.  Skin: No paleness, no jaundice, no cyanosis. No lesion, no ulcer, no rash.  Neurologic / Psychiatric: Oriented to time, oriented to place, oriented to person. No depression, no anxiety, no agitation.  Gastrointestinal: No mass, no tenderness, no rigidity, non obese abdomen.  Eyes: Normal conjunctivae. Normal eyelids.  Ears, Nose, Mouth, and Throat: Left ear no scars, no lesions, no masses. Right ear no scars, no lesions, no masses. Nose no scars, no lesions, no masses. Normal hearing. Normal lips.  Musculoskeletal: Normal gait and station of head and neck.    PAST DATA REVIEWED:  Source Of History:  Patient  Records Review:   Previous Patient Records, POC Tool    PROCEDURES:         CT scan IMPRESSION:  1. 5 mm calculus now seen at the left ureterovesical junction,  without significant hydroureteronephrosis.  2. 5 mm left intrarenal calculus.  3. Cholelithiasis. No radiographic evidence of cholecystitis.  4. Colonic diverticulosis. No radiographic evidence of  diverticulitis.  5. Tiny calcified uterine fibroid.        Urinalysis w/Scope Dipstick Dipstick Cont'd Micro  Color: Yellow Bilirubin: Neg mg/dL WBC/hpf: 10 - 20/hpf  Appearance: Clear Ketones: Neg mg/dL RBC/hpf: 0 - 2/hpf  Specific Gravity: 1.025 Blood: Neg ery/uL Bacteria: Few (10-25/hpf)  pH: 5.5 Protein: Neg mg/dL Cystals: NS (Not Seen)  Glucose: Neg mg/dL Urobilinogen: 0.2 mg/dL Casts: NS (Not Seen)    Nitrites: Neg Trichomonas: Not Present    Leukocyte Esterase: 2+ leu/uL Mucous: Present      Epithelial Cells: 0 - 5/hpf      Yeast: NS (Not Seen)      Sperm: Not Present    ASSESSMENT/PLAN:      ICD-10 Details  1 GU:   Ureteral calculus - N20.1 Chronic, Stable  2   Suprapubic pain - [id number] Acute, Uncomplicated  4   Renal calculus - N20.0 Left, Chronic, Stable  3 NON-GU:   Pyuria/other UA findings - 99991111 Acute, Uncomplicated   I spoke with the patient about the radiologist's interpretation of her CT scan. It was read as showing a 5 mm calcification at the level of the left ureterovesical junction without associated hydronephrosis as well as the 5 mm stone seen within her left kidney. I told her that I had seen calcifications in this area on her previous KUB ease and did not think that this stone was in the ureter but it would seem to explain the symptoms she is experiencing. I told her that we have given this potential stone along enough time to pass spontaneously and therefore I have recommended surgical intervention since she is fairly symptomatic. Because of the question of this presence of the stone within the ureter I did recommend lithotripsy. I therefore have discussed  proceeding with ureteroscopic management with an initial retrograde pyelogram in order to confirm presence of a stone in her ureter. If that is the case I told her I would then proceed with ureteroscopy and possible laser lithotripsy with stent placement. I went over the procedure with her in detail including the risks and complications, the probability of success, the outpatient nature of the procedure as well as anticipated postoperative course. She understands and would like to proceed.

## 2019-03-31 ENCOUNTER — Ambulatory Visit (HOSPITAL_BASED_OUTPATIENT_CLINIC_OR_DEPARTMENT_OTHER): Payer: Medicare PPO | Admitting: Anesthesiology

## 2019-03-31 ENCOUNTER — Encounter (HOSPITAL_BASED_OUTPATIENT_CLINIC_OR_DEPARTMENT_OTHER)
Admission: RE | Disposition: A | Discharge status: Home / Self Care | Payer: Self-pay | Source: Home / Self Care | Attending: Urology

## 2019-03-31 ENCOUNTER — Ambulatory Visit (HOSPITAL_BASED_OUTPATIENT_CLINIC_OR_DEPARTMENT_OTHER)
Admission: RE | Admit: 2019-03-31 | Discharge: 2019-03-31 | Disposition: A | Discharge status: Home / Self Care | Payer: Medicare PPO | Attending: Urology | Admitting: Urology

## 2019-03-31 ENCOUNTER — Encounter (HOSPITAL_BASED_OUTPATIENT_CLINIC_OR_DEPARTMENT_OTHER): Payer: Self-pay | Admitting: Urology

## 2019-03-31 ENCOUNTER — Ambulatory Visit (HOSPITAL_COMMUNITY): Payer: Medicare PPO

## 2019-03-31 DIAGNOSIS — Z888 Allergy status to other drugs, medicaments and biological substances status: Secondary | ICD-10-CM | POA: Diagnosis not present

## 2019-03-31 DIAGNOSIS — N201 Calculus of ureter: Secondary | ICD-10-CM | POA: Diagnosis not present

## 2019-03-31 DIAGNOSIS — Z87442 Personal history of urinary calculi: Secondary | ICD-10-CM | POA: Diagnosis not present

## 2019-03-31 DIAGNOSIS — Z885 Allergy status to narcotic agent status: Secondary | ICD-10-CM | POA: Diagnosis not present

## 2019-03-31 DIAGNOSIS — M1990 Unspecified osteoarthritis, unspecified site: Secondary | ICD-10-CM | POA: Insufficient documentation

## 2019-03-31 DIAGNOSIS — Z96653 Presence of artificial knee joint, bilateral: Secondary | ICD-10-CM | POA: Insufficient documentation

## 2019-03-31 DIAGNOSIS — K5730 Diverticulosis of large intestine without perforation or abscess without bleeding: Secondary | ICD-10-CM | POA: Diagnosis not present

## 2019-03-31 DIAGNOSIS — N202 Calculus of kidney with calculus of ureter: Secondary | ICD-10-CM | POA: Diagnosis present

## 2019-03-31 DIAGNOSIS — K8020 Calculus of gallbladder without cholecystitis without obstruction: Secondary | ICD-10-CM | POA: Insufficient documentation

## 2019-03-31 HISTORY — DX: Calculus of ureter: N20.1

## 2019-03-31 HISTORY — DX: Personal history of other diseases of the female genital tract: Z87.42

## 2019-03-31 HISTORY — PX: CYSTOSCOPY/URETEROSCOPY/HOLMIUM LASER/STENT PLACEMENT: SHX6546

## 2019-03-31 HISTORY — DX: Personal history of urinary calculi: Z87.442

## 2019-03-31 SURGERY — CYSTOSCOPY/URETEROSCOPY/HOLMIUM LASER/STENT PLACEMENT
Anesthesia: General | Site: Ureter | Laterality: Left

## 2019-03-31 MED ORDER — PHENAZOPYRIDINE HCL 200 MG PO TABS: 200.0000 mg | ORAL_TABLET | Freq: Three times a day (TID) | ORAL | 0 refills | Status: DC | PRN

## 2019-03-31 MED ORDER — FENTANYL CITRATE (PF) 100 MCG/2ML IJ SOLN
INTRAMUSCULAR | Status: AC
  Filled 2019-03-31: qty 2

## 2019-03-31 MED ORDER — PROPOFOL 10 MG/ML IV BOLUS
INTRAVENOUS | Status: DC | PRN
  Administered 2019-03-31: 120 mg via INTRAVENOUS

## 2019-03-31 MED ORDER — FENTANYL CITRATE (PF) 100 MCG/2ML IJ SOLN
INTRAMUSCULAR | Status: DC | PRN
  Administered 2019-03-31: 50 ug via INTRAVENOUS

## 2019-03-31 MED ORDER — ONDANSETRON HCL 4 MG/2ML IJ SOLN
INTRAMUSCULAR | Status: DC | PRN
  Administered 2019-03-31: 4 mg via INTRAVENOUS

## 2019-03-31 MED ORDER — LIDOCAINE 2% (20 MG/ML) 5 ML SYRINGE
INTRAMUSCULAR | Status: AC
  Filled 2019-03-31: qty 5

## 2019-03-31 MED ORDER — LIDOCAINE 2% (20 MG/ML) 5 ML SYRINGE
INTRAMUSCULAR | Status: DC | PRN
  Administered 2019-03-31: 60 mg via INTRAVENOUS

## 2019-03-31 MED ORDER — SCOPOLAMINE 1 MG/3DAYS TD PT72
MEDICATED_PATCH | TRANSDERMAL | Status: AC
  Filled 2019-03-31: qty 1

## 2019-03-31 MED ORDER — SODIUM CHLORIDE 0.9 % IR SOLN
Status: DC | PRN
  Administered 2019-03-31: 3000 mL

## 2019-03-31 MED ORDER — ONDANSETRON HCL 4 MG/2ML IJ SOLN
4.0000 mg | Freq: Once | INTRAMUSCULAR | Status: DC | PRN
  Filled 2019-03-31: qty 2

## 2019-03-31 MED ORDER — PHENAZOPYRIDINE HCL 200 MG PO TABS
200.0000 mg | ORAL_TABLET | Freq: Once | ORAL | Status: AC
  Administered 2019-03-31: 11:00:00 200 mg via ORAL
  Filled 2019-03-31: qty 1

## 2019-03-31 MED ORDER — CIPROFLOXACIN IN D5W 400 MG/200ML IV SOLN
INTRAVENOUS | Status: AC
  Filled 2019-03-31: qty 200

## 2019-03-31 MED ORDER — PROPOFOL 500 MG/50ML IV EMUL
INTRAVENOUS | Status: AC
  Filled 2019-03-31: qty 50

## 2019-03-31 MED ORDER — LACTATED RINGERS IV SOLN
INTRAVENOUS | Status: DC
  Filled 2019-03-31: qty 1000

## 2019-03-31 MED ORDER — SCOPOLAMINE 1 MG/3DAYS TD PT72
1.0000 | MEDICATED_PATCH | TRANSDERMAL | Status: DC
  Administered 2019-03-31: 1.5 mg via TRANSDERMAL
  Filled 2019-03-31: qty 1

## 2019-03-31 MED ORDER — FENTANYL CITRATE (PF) 100 MCG/2ML IJ SOLN
25.0000 ug | INTRAMUSCULAR | Status: DC | PRN
  Administered 2019-03-31 (×2): 25 ug via INTRAVENOUS
  Filled 2019-03-31: qty 1

## 2019-03-31 MED ORDER — PHENAZOPYRIDINE HCL 100 MG PO TABS
ORAL_TABLET | ORAL | Status: AC
  Filled 2019-03-31: qty 2

## 2019-03-31 MED ORDER — ONDANSETRON HCL 4 MG/2ML IJ SOLN
INTRAMUSCULAR | Status: AC
  Filled 2019-03-31: qty 2

## 2019-03-31 MED ORDER — IOHEXOL 300 MG/ML  SOLN
INTRAMUSCULAR | Status: DC | PRN
  Administered 2019-03-31: 10 mL

## 2019-03-31 MED ORDER — CIPROFLOXACIN IN D5W 400 MG/200ML IV SOLN
400.0000 mg | Freq: Once | INTRAVENOUS | Status: AC
  Administered 2019-03-31: 400 mg via INTRAVENOUS
  Filled 2019-03-31: qty 200

## 2019-03-31 MED ORDER — ACETAMINOPHEN 500 MG PO TABS
ORAL_TABLET | ORAL | Status: AC
  Filled 2019-03-31: qty 2

## 2019-03-31 MED ORDER — DEXAMETHASONE SODIUM PHOSPHATE 10 MG/ML IJ SOLN
INTRAMUSCULAR | Status: AC
  Filled 2019-03-31: qty 1

## 2019-03-31 MED ORDER — PROPOFOL 500 MG/50ML IV EMUL
INTRAVENOUS | Status: DC | PRN
  Administered 2019-03-31: 150 ug/kg/min via INTRAVENOUS

## 2019-03-31 MED ORDER — ACETAMINOPHEN 500 MG PO TABS
1000.0000 mg | ORAL_TABLET | Freq: Once | ORAL | Status: AC
  Administered 2019-03-31: 1000 mg via ORAL
  Filled 2019-03-31: qty 2

## 2019-03-31 MED ORDER — DEXAMETHASONE SODIUM PHOSPHATE 10 MG/ML IJ SOLN
INTRAMUSCULAR | Status: DC | PRN
  Administered 2019-03-31: 8 mg via INTRAVENOUS

## 2019-03-31 SURGICAL SUPPLY — 26 items
BAG DRAIN URO-CYSTO SKYTR STRL (DRAIN) ×2 IMPLANT
BAG DRN UROCATH (DRAIN) ×1
BASKET LASER NITINOL 1.9FR (BASKET) ×2 IMPLANT
BASKET ZERO TIP NITINOL 2.4FR (BASKET) IMPLANT
BSKT STON RTRVL 120 1.9FR (BASKET) ×1
BSKT STON RTRVL ZERO TP 2.4FR (BASKET)
CATH INTERMIT  6FR 70CM (CATHETERS) IMPLANT
CATH URET 5FR 28IN CONE TIP (BALLOONS)
CATH URET 5FR 70CM CONE TIP (BALLOONS) IMPLANT
CLOTH BEACON ORANGE TIMEOUT ST (SAFETY) ×2 IMPLANT
EXTRACTOR STONE 1.7FRX115CM (UROLOGICAL SUPPLIES) IMPLANT
FIBER LASER FLEXIVA 365 (UROLOGICAL SUPPLIES) IMPLANT
FIBER LASER TRAC TIP (UROLOGICAL SUPPLIES) IMPLANT
GLOVE BIO SURGEON STRL SZ8 (GLOVE) ×2 IMPLANT
GOWN STRL REUS W/TWL XL LVL3 (GOWN DISPOSABLE) ×2 IMPLANT
GUIDEWIRE ANG ZIPWIRE 038X150 (WIRE) IMPLANT
GUIDEWIRE STR DUAL SENSOR (WIRE) ×2 IMPLANT
IV NS IRRIG 3000ML ARTHROMATIC (IV SOLUTION) ×4 IMPLANT
KIT TURNOVER CYSTO (KITS) ×2 IMPLANT
MANIFOLD NEPTUNE II (INSTRUMENTS) IMPLANT
NS IRRIG 500ML POUR BTL (IV SOLUTION) ×2 IMPLANT
PACK CYSTO (CUSTOM PROCEDURE TRAY) ×2 IMPLANT
SHEATH URETERAL 12FRX28CM (UROLOGICAL SUPPLIES) ×1 IMPLANT
TUBE CONNECTING 12X1/4 (SUCTIONS) IMPLANT
TUBING UROLOGY SET (TUBING) IMPLANT
WATER STERILE IRR 3000ML UROMA (IV SOLUTION) IMPLANT

## 2019-03-31 NOTE — Op Note (Signed)
PATIENT:  Caleesi Glatt Persichetti  PRE-OPERATIVE DIAGNOSIS:  left Ureteral calculus  POST-OPERATIVE DIAGNOSIS: Same  PROCEDURE:  1. Cystoscopy with left retrograde pyelogram including interpretation. 2. Left ureteroscopy and stone extraction.  SURGEON: Claybon Jabs, MD  INDICATION: Mrs. Ehrhard is a 74 year old female with a history of calculus disease.  She was found to have a left ureteral stone and underwent medical expulsive therapy with what appeared to be passage of the stone as her urine cleared and her stone was not visible on KUB but she continued to have intermittent left sided pain and suprapubic pressure.  A repeat CT scan was obtained and revealed the stone remained present in her distal ureter.  She presents today for ureteroscopic management.  ANESTHESIA:  General  EBL:  Minimal  DRAINS: None  SPECIMEN: Stone given to patient  DESCRIPTION OF PROCEDURE: The patient was taken to the major OR and placed on the table. General anesthesia was administered and then the patient was moved to the dorsal lithotomy position. The genitalia was sterilely prepped and draped. An official timeout was performed.  Initially the 62 French cystoscope with 30 lens was passed under direct vision into the bladder. The bladder was then fully inspected. It was noted be free of any tumors, stones or inflammatory lesions. Ureteral orifices were of normal configuration and position. A 6 French open-ended ureteral catheter was then passed through the cystoscope into the ureteral orifice in order to perform a left retrograde pyelogram.  A retrograde pyelogram was performed by injecting full-strength contrast up the left ureter under direct fluoroscopic control. It revealed a filling defect in the distal ureter consistent with the stone seen on the preoperative KUB. The remainder of the ureter was noted to be normal as was the intrarenal collecting system.  There was no hydronephrosis/dilatation of the ureter  proximal to the stone.  I then passed a 0.038 inch floppy-tipped guidewire through the open ended catheter and into the area of the renal pelvis and this was left in place. The inner portion of a ureteral access sheath was then passed over the guidewire to gently dilate the intramural ureter. I then proceeded with ureteroscopy.  A 5 French rigid ureteroscope was then passed under direct into the bladder and into the left orifice and up the ureter. The stone was identified and was felt to be of a size and shape that it could be extracted.  I therefore passed an escape basket through the ureteroscope and manipulated the stone so that its long axis was in the basket parallel to the long axis of the ureter and then was able to very easily extract the stone.  I then removed the guidewire and the patient was awakened and taken to the recovery room in stable and satisfactory condition.  She tolerated the procedure well with no intraoperative complications.  PLAN OF CARE: Discharge to home after PACU  PATIENT DISPOSITION:  PACU - hemodynamically stable.

## 2019-03-31 NOTE — Anesthesia Postprocedure Evaluation (Signed)
Anesthesia Post Note  Patient: Veronica Phelps  Procedure(s) Performed: CYSTOSCOPY/URETEROSCOPY/HOLMIUM LASER (Left Ureter)     Patient location during evaluation: PACU Anesthesia Type: General Level of consciousness: awake and alert and oriented Pain management: pain level controlled Vital Signs Assessment: post-procedure vital signs reviewed and stable Respiratory status: spontaneous breathing, nonlabored ventilation and respiratory function stable Cardiovascular status: blood pressure returned to baseline and stable Postop Assessment: no apparent nausea or vomiting Anesthetic complications: no    Last Vitals:  Vitals:   03/31/19 1045 03/31/19 1100  BP: 136/69 (!) 156/75  Pulse: 62 67  Resp: (!) 9 14  Temp:    SpO2: 95% 98%    Last Pain:  Vitals:   03/31/19 1030  TempSrc:   PainSc: 6                  Niel Peretti A.

## 2019-03-31 NOTE — Transfer of Care (Signed)
   Post vital signs:Immediate Anesthesia Transfer of Care Note  Patient: Veronica Phelps  Procedure(s) Performed: Procedure(s) (LRB): CYSTOSCOPY/URETEROSCOPY/HOLMIUM LASER (Left)  Patient Location: PACU  Anesthesia Type: General  Level of Consciousness: awake, alert  and oriented  Airway & Oxygen Therapy: Patient Spontanous Breathing and Patient connected to nasal cannula oxygen  Post-op Assessment: Report given to PACU RN and Post -op Vital signs reviewed and stable  Post vital signs: Reviewed and stable  Complications: No apparent anesthesia complications Last Vitals:  Vitals Value Taken Time  BP 137/71 03/31/19 0939  Temp    Pulse 69 03/31/19 0942  Resp 22 03/31/19 0942  SpO2 100 % 03/31/19 0942  Vitals shown include unvalidated device data.  Last Pain:  Vitals:   03/31/19 0938  TempSrc:   PainSc: 0-No pain      Patients Stated Pain Goal: 5 ([id number])  Complications:

## 2019-03-31 NOTE — Discharge Instructions (Signed)
Post stone removal surgery instructions   Definitions:  Ureter: The duct that transports urine from the kidney to the bladder.  General instructions:  Despite the fact that no skin incisions were used, the area around the ureter and bladder is raw and irritated.  You may see some blood in your urine while the stent is in place and a few days afterward. Do not be alarmed, even if the urine is clear for a while. Get off your feet and drink lots of fluids until clearing occurs. If you start to pass clots or don't improve, call us.  If you have a string coming from your urethra:  The stent string is attached to your ureteral stent.  Do not pull on thisIf you have a string coming from your urethra:  The stent string is attached to your ureteral stent.  Do not pull on this.  Diet:  You may return to your normal diet immediately. Because of the raw surface of your bladder, alcohol, spicy foods, foods high in acid and drinks with caffeine may cause irritation or frequency and should be used in moderation. To keep your urine flowing freely and avoid constipation, drink plenty of fluids during the day (8-10 glasses). Tip: Avoid cranberry juice because it is very acidic.  Activity:  Your physical activity doesn't need to be restricted. However, if you are very active, you may see some blood in the urine. We suggest that you reduce your activity under the circumstances until the bleeding has stopped.  Bowels:  It is important to keep your bowels regular during the postoperative period. Straining with bowel movements can cause bleeding. A bowel movement every other day is reasonable. Use a mild laxative if needed, such as milk of magnesia 2-3 tablespoons, or 2 Dulcolax tablets. Call if you continue to have problems. If you had been taking narcotics for pain, before, during or after your surgery, you may be constipated. Take a laxative if necessary.     Medication:  You should resume your  pre-surgery medications unless told not to. DO NOT RESUME YOUR ASPIRIN, or any other medicines like ibuprofen, motrin, excedrin, advil, aleve, vitamin E, fish oil as these can all cause bleeding x 7 days. In addition you may be given an antibiotic to prevent or treat infection. Antibiotics are not always necessary. All medication should be taken as prescribed until the bottles are finished unless you are having an unusual reaction to one of the drugs.  Problems you should report to [place]:  a. Fever greater than 101F. b. Heavy bleeding, or clots (see notes above about blood in urine). c. Inability to urinate. d. Drug reactions (hives, rash, nausea, vomiting, diarrhea). e. Severe burning or pain with urination that is not improving.  Followup:  You will need a followup appointment to monitor your progress in most cases. Please call the office for this appointment when you get home if your appointment has not already been scheduled. Usually the first appointment will be about 5-14 days after your surgery and if you have a stent in place it will likely be removed at that time.   Post Anesthesia Home Care Instructions  Activity: Get plenty of rest for the remainder of the day. A responsible adult should stay with you for 24 hours following the procedure.  For the next 24 hours, DO NOT: -Drive a car -Paediatric nurse -Drink alcoholic beverages -Take any medication unless instructed by your physician -Make any legal decisions or sign important papers.  Meals:  Start with liquid foods such as gelatin or soup. Progress to regular foods as tolerated. Avoid greasy, spicy, heavy foods. If nausea and/or vomiting occur, drink only clear liquids until the nausea and/or vomiting subsides. Call your physician if vomiting continues.  Special Instructions/Symptoms: Your throat may feel dry or sore from the anesthesia or the breathing tube placed in your throat during surgery. If this causes discomfort,  gargle with warm salt water. The discomfort should disappear within 24 hours.  If you had a scopolamine patch placed behind your ear for the management of post- operative nausea and/or vomiting:  1. The medication in the patch is effective for 72 hours, after which it should be removed.  Wrap patch in a tissue and discard in the trash. Wash hands thoroughly with soap and water. 2. You may remove the patch earlier than 72 hours if you experience unpleasant side effects which may include dry mouth, dizziness or visual disturbances. 3. Avoid touching the patch. Wash your hands with soap and water after contact with the patch.

## 2019-03-31 NOTE — Anesthesia Preprocedure Evaluation (Addendum)
Anesthesia Evaluation  Patient identified by MRN, date of birth, ID band Patient awake    Reviewed: Allergy & Precautions, NPO status , Patient's Chart, lab work & pertinent test results  History of Anesthesia Complications (+) PONV and history of anesthetic complications  Airway Mallampati: II  TM Distance: >3 FB Neck ROM: Full    Dental no notable dental hx. (+) Teeth Intact, Caps   Pulmonary neg pulmonary ROS,    Pulmonary exam normal breath sounds clear to auscultation       Cardiovascular negative cardio ROS Normal cardiovascular exam Rhythm:Regular Rate:Normal     Neuro/Psych negative neurological ROS  negative psych ROS   GI/Hepatic negative GI ROS,   Endo/Other  negative endocrine ROS  Renal/GU Renal diseaseHx/o renal calculi Left ureteral calculus  negative genitourinary   Musculoskeletal  (+) Arthritis , Osteoarthritis,  S/P Bilateral TKR   Abdominal   Peds  Hematology   Anesthesia Other Findings   Reproductive/Obstetrics                             Anesthesia Physical Anesthesia Plan  ASA: II  Anesthesia Plan: General   Post-op Pain Management:    Induction: Intravenous  PONV Risk Score and Plan: 4 or greater and Ondansetron, Dexamethasone, Treatment may vary due to age or medical condition, TIVA and Scopolamine patch - Pre-op  Airway Management Planned: LMA  Additional Equipment:   Intra-op Plan:   Post-operative Plan: Extubation in OR  Informed Consent: I have reviewed the patients History and Physical, chart, labs and discussed the procedure including the risks, benefits and alternatives for the proposed anesthesia with the patient or authorized representative who has indicated his/her understanding and acceptance.     Dental advisory given  Plan Discussed with: CRNA, Anesthesiologist and Surgeon  Anesthesia Plan Comments:        Anesthesia Quick  Evaluation

## 2019-03-31 NOTE — Anesthesia Procedure Notes (Signed)
Procedure Name: LMA Insertion Date/Time: 03/31/2019 9:17 AM Performed by: Mechele Claude, CRNA Pre-anesthesia Checklist: Patient identified, Emergency Drugs available, Suction available and Patient being monitored Patient Re-evaluated:Patient Re-evaluated prior to induction Oxygen Delivery Method: Circle system utilized Preoxygenation: Pre-oxygenation with 100% oxygen Induction Type: IV induction Ventilation: Mask ventilation without difficulty LMA: LMA inserted LMA Size: 4.0 Number of attempts: 1 Airway Equipment and Method: Bite block Placement Confirmation: positive ETCO2 Tube secured with: Tape Dental Injury: Teeth and Oropharynx as per pre-operative assessment

## 2019-09-13 IMAGING — CT CT ABD-PELV W/ CM
2 of 6 series · 16 of 46 positions shown, 18 images · IV contrast (omnipaque)
Comparison: CT the abdomen and pelvis 03/13/2014.

CLINICAL DATA: 73-year-old female with history of left lower
quadrant abdominal pain with nausea and vomiting.

EXAM:
CT ABDOMEN AND PELVIS WITH CONTRAST
TECHNIQUE: Multidetector CT imaging of the abdomen and pelvis was performed
using the standard protocol following bolus administration of
intravenous contrast.
CONTRAST:  100mL OMNIPAQUE IOHEXOL 300 MG/ML  SOLN

[Series 2: axial st · axial · 0.79mm/px · z∈[+1108,+1473]mm · 13 of 87 slices shown, 15 images]
[im 7/87  soft-tissue]
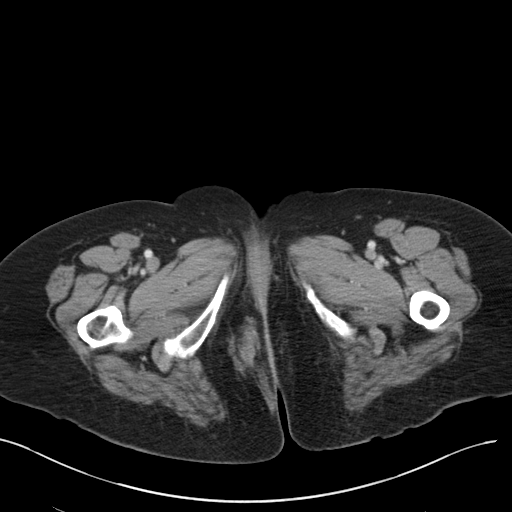
[im 7/87  bone]
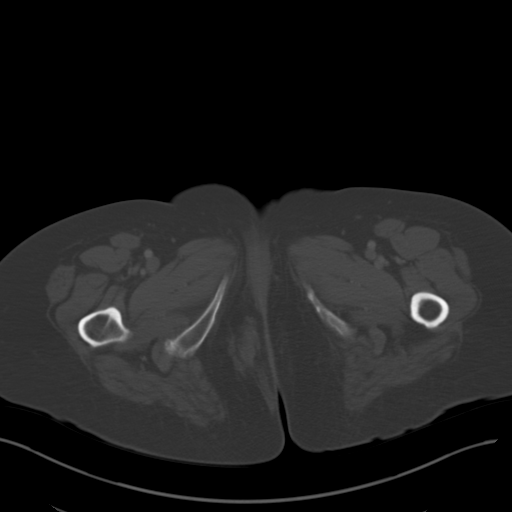
[im 13/87  soft-tissue]
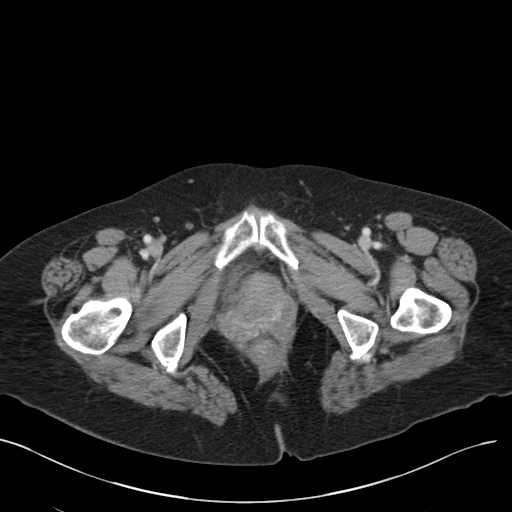
[im 19/87  soft-tissue]
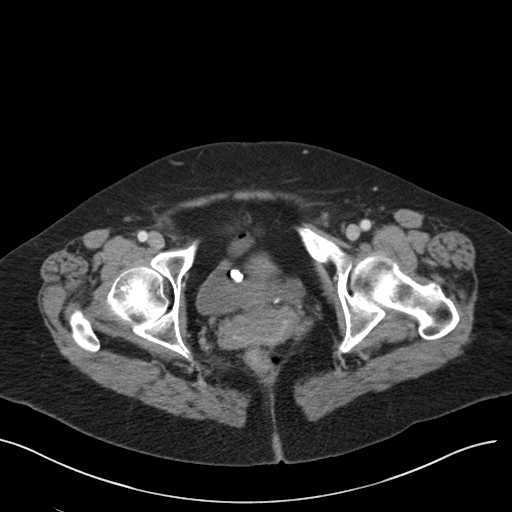
[im 25/87  soft-tissue]
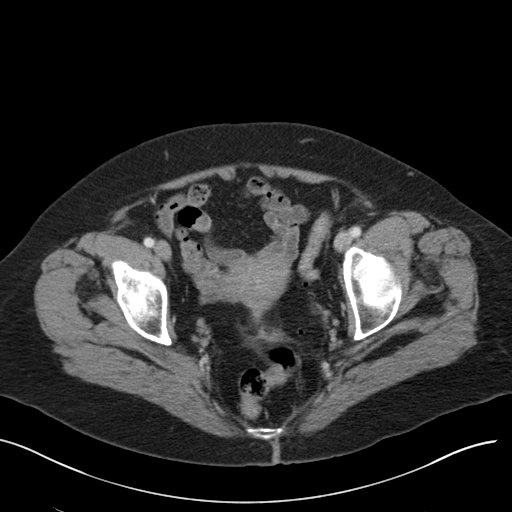
[im 31/87  soft-tissue]
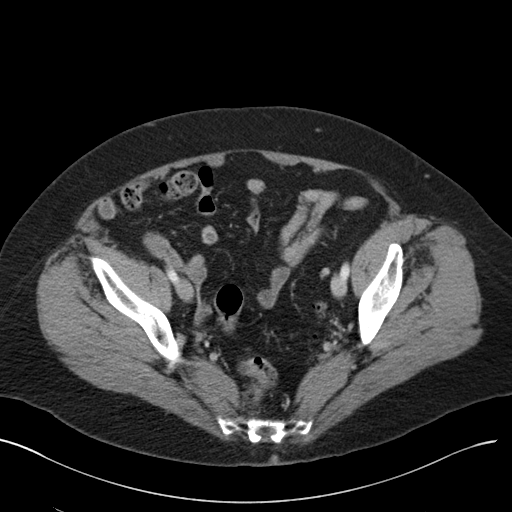
[im 37/87  soft-tissue]
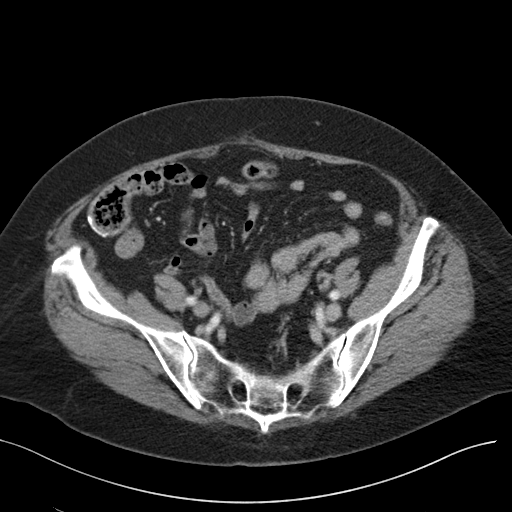
[im 44/87  soft-tissue]
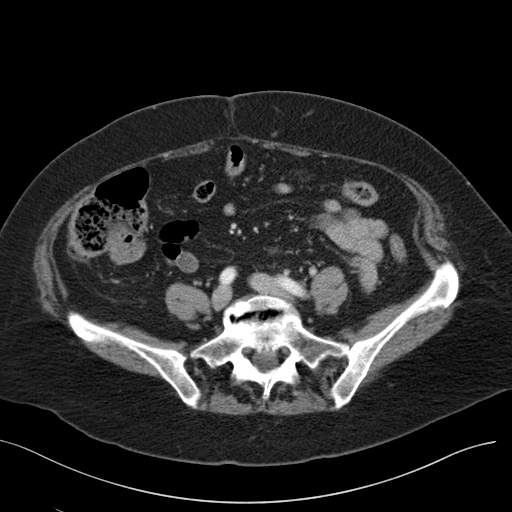
[im 50/87  soft-tissue]
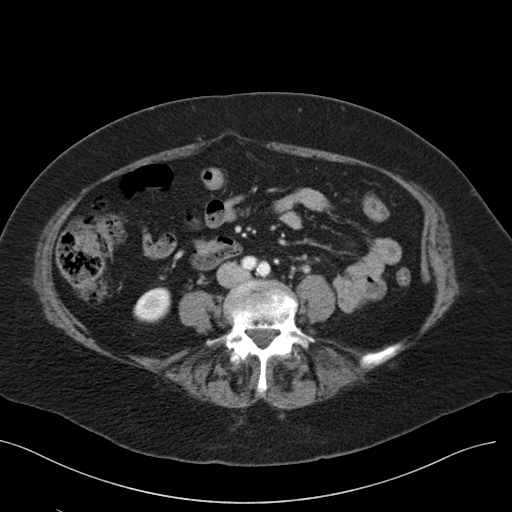
[im 56/87  soft-tissue]
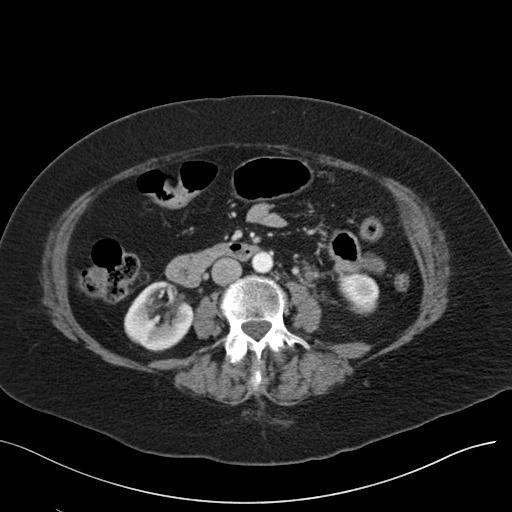
[im 56/87  bone]
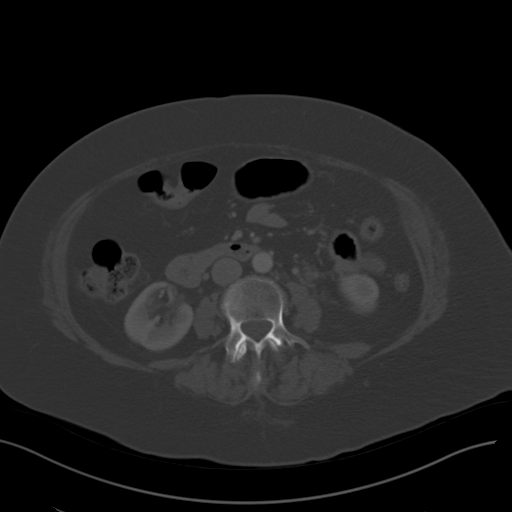
[im 62/87  soft-tissue]
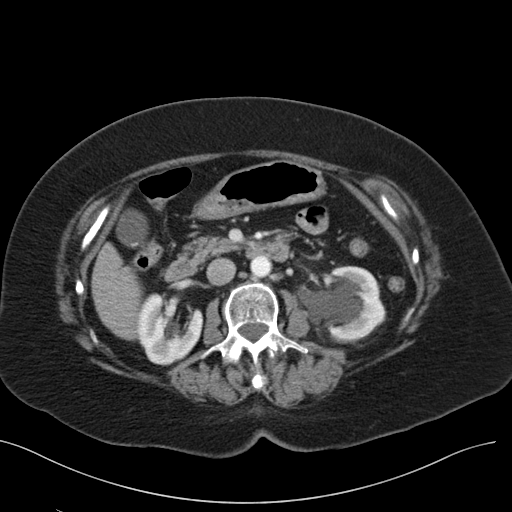
[im 68/87  soft-tissue]
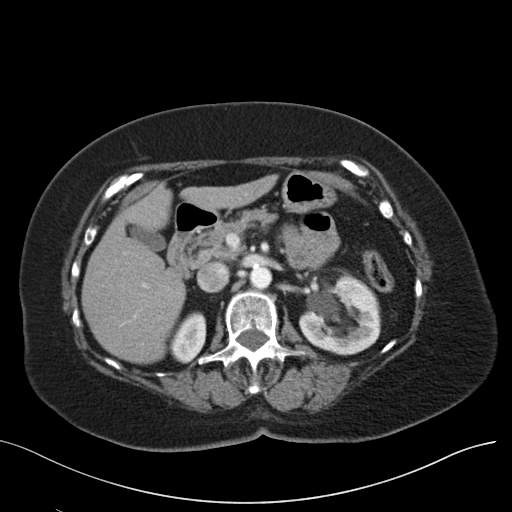
[im 74/87  soft-tissue]
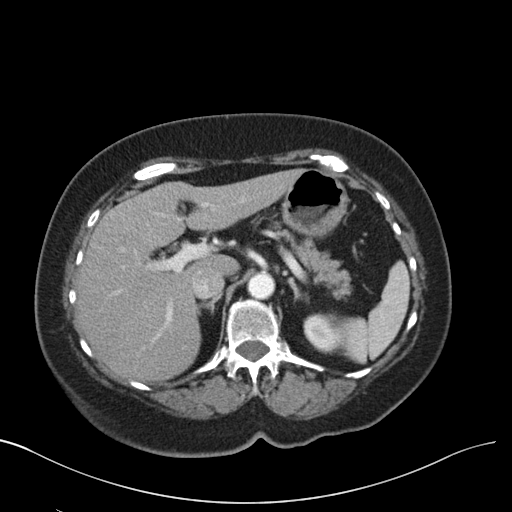
[im 80/87  soft-tissue]
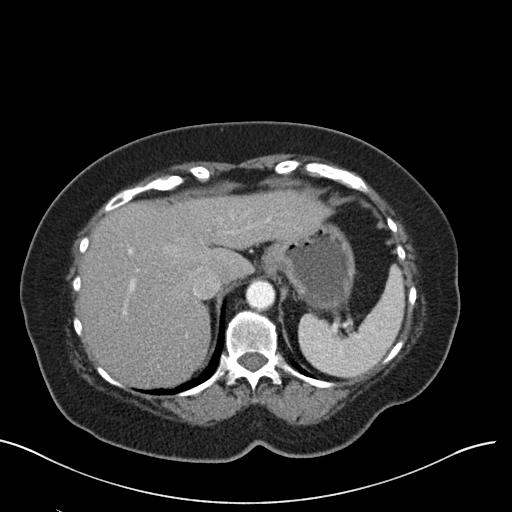

[Series 6: coronal st · coronal · 0.90mm/px · 3 of 144 slices shown]
[im 48/144  soft-tissue]
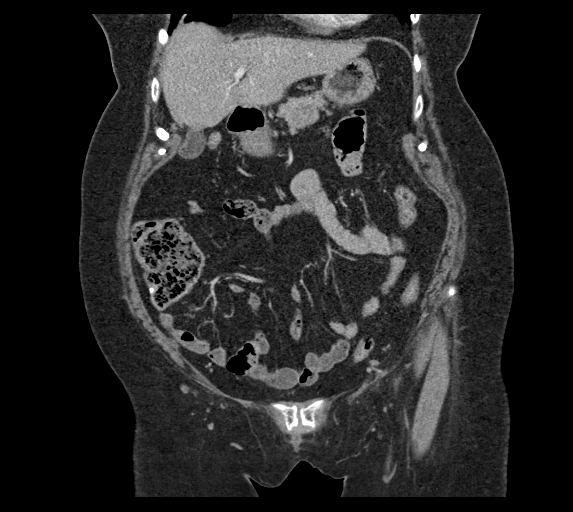
[im 64/144  soft-tissue]
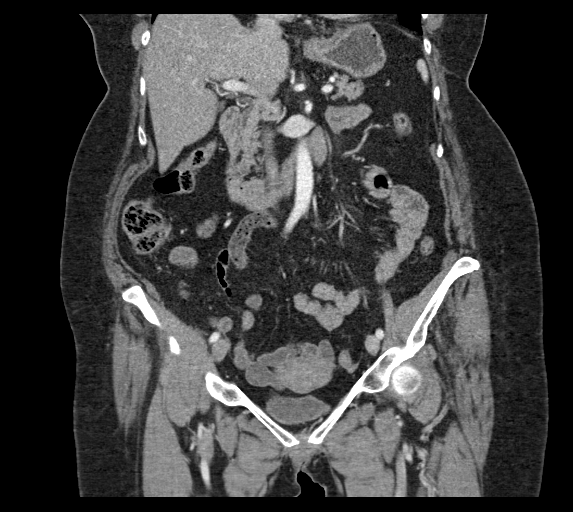
[im 80/144  soft-tissue]
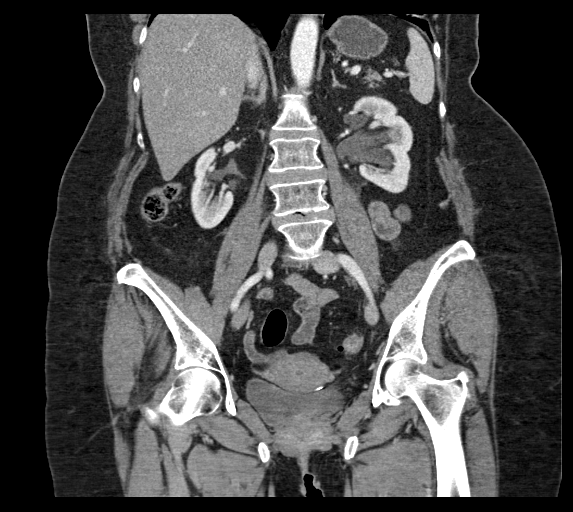

[16 of 46 positions shown; findings below may reference images not displayed]

FINDINGS: Lower chest: Areas of scarring or atelectasis in the lung bases
bilaterally.

Hepatobiliary: No suspicious cystic or solid hepatic lesions are
noted in the visualized portions of the liver (superior aspect of
the right lobe of the liver is incompletely imaged). No intra or
extrahepatic biliary ductal dilatation. Tiny calcified gallstones
lying dependently in the gallbladder. No findings to suggest an
acute cholecystitis at this time.

Pancreas: No pancreatic mass. No pancreatic ductal dilatation. No
pancreatic or peripancreatic fluid collections or inflammatory
changes.

Spleen: Unremarkable.

Adrenals/Urinary Tract: 7 mm nonobstructive calculus in the
interpolar collecting system of the left kidney. 6 mm calculus also
noted at the left ureteropelvic junction (coronal image 73 of series
6) with mild proximal left hydronephrosis. Right kidney and
bilateral adrenal glands are normal in appearance. No right
hydroureteronephrosis. Urinary bladder is normal in appearance.

Stomach/Bowel: Normal appearance of the stomach. No pathologic
dilatation of small bowel or colon. Numerous colonic diverticulae
are noted, particularly in the sigmoid colon, without surrounding
inflammatory changes to suggest an acute diverticulitis at this
time. Status post appendectomy.

Vascular/Lymphatic: Aortic atherosclerosis, without evidence of
aneurysm or dissection in the abdominal or pelvic vasculature. No
lymphadenopathy noted in the abdomen or pelvis.

Reproductive: Uterus and ovaries are unremarkable in appearance.

Other: No significant volume of ascites.  No pneumoperitoneum.

Musculoskeletal: There are no aggressive appearing lytic or blastic
lesions noted in the visualized portions of the skeleton.
IMPRESSION: 1. 6 mm calculus at the left ureteropelvic junction with mild left
hydronephrosis. There is also a 7 mm nonobstructive calculus in the
interpolar collecting system of left kidney.
2. Colonic diverticulosis without evidence of acute diverticulitis
at this time.
3. Cholelithiasis without evidence of acute cholecystitis at this
time.
4. Aortic atherosclerosis.

## 2019-11-23 DIAGNOSIS — Z85820 Personal history of malignant melanoma of skin: Secondary | ICD-10-CM | POA: Diagnosis not present

## 2019-11-23 DIAGNOSIS — D2261 Melanocytic nevi of right upper limb, including shoulder: Secondary | ICD-10-CM | POA: Diagnosis not present

## 2019-11-23 DIAGNOSIS — D2262 Melanocytic nevi of left upper limb, including shoulder: Secondary | ICD-10-CM | POA: Diagnosis not present

## 2019-11-23 DIAGNOSIS — D2272 Melanocytic nevi of left lower limb, including hip: Secondary | ICD-10-CM | POA: Diagnosis not present

## 2019-11-23 DIAGNOSIS — L814 Other melanin hyperpigmentation: Secondary | ICD-10-CM | POA: Diagnosis not present

## 2019-11-23 DIAGNOSIS — D1801 Hemangioma of skin and subcutaneous tissue: Secondary | ICD-10-CM | POA: Diagnosis not present

## 2019-11-23 DIAGNOSIS — Z85828 Personal history of other malignant neoplasm of skin: Secondary | ICD-10-CM | POA: Diagnosis not present

## 2019-11-23 DIAGNOSIS — D692 Other nonthrombocytopenic purpura: Secondary | ICD-10-CM | POA: Diagnosis not present

## 2019-11-23 DIAGNOSIS — L821 Other seborrheic keratosis: Secondary | ICD-10-CM | POA: Diagnosis not present

## 2020-02-14 DIAGNOSIS — E559 Vitamin D deficiency, unspecified: Secondary | ICD-10-CM | POA: Diagnosis not present

## 2020-02-14 DIAGNOSIS — K9089 Other intestinal malabsorption: Secondary | ICD-10-CM | POA: Diagnosis not present

## 2020-02-15 DIAGNOSIS — E559 Vitamin D deficiency, unspecified: Secondary | ICD-10-CM | POA: Diagnosis not present

## 2020-02-17 NOTE — Progress Notes (Signed)
Assessment/Plan:    1.  Parkinson's disease (long history of essential tremor, now with Parkinson's disease)  -Have been watching the patient for transition to see if she would meet criteria for Parkinson's disease.  She does meet the criteria today, although just barely.  She and I discussed this.  Her most bothersome aspect has been tremor.  Discussed that medications are not always helpful for tremor, but she would like to try it.  -We discussed the diagnosis as well as pathophysiology of the disease.  We discussed treatment options as well as prognostic indicators.  Patient education was provided.  -We decided to add carbidopa/levodopa 25/100.  1/2 tab tid x 1 wk, then 1/2 in am & noon & 1 at night for a week, then 1/2 in am &1 at noon &night for a week, then 1 po tid.  Risks, benefits, side effects and alternative therapies were discussed.  The opportunity to ask questions was given and they were answered to the best of my ability.  The patient expressed understanding and willingness to follow the outlined treatment protocols.  -Patient lives almost 3 hours away from here.  She is going to check into therapy programs in her area for Parkinson's disease.  I also asked her to check rock steady boxing programs  2.  I will send a referral to Dr. Jairo Ben in [place], given that the patient lives much closer to her than she does need.  Patient will follow up here on an as-needed basis.     Subjective:   Veronica Phelps was seen today in follow up for tremor.  I have not seen the patient in about 2-1/2 years.  At that point in time, she did not want to make her regular follow-up visit.  She had a long history of essential tremor, but had developed some LUE rest and jaw tremor and I was concerned that potentially she was developing a parkinsonian syndrome.  My previous records were reviewed prior to todays visit.  Since that time, the patient states that she moved to [place] but her  husband had an appointment here today so she decided to follow up.  Her jaw and LUE tremor is getting worse. She states that she can feel the jaw tremor more with the mask on.   No falls.  The L arm feels "tired" from the tremor and "I am left handed."  She has inner tremor.  She has no sx's of the L leg.    Current prescribed movement disorder medications: None  ALLERGIES:   Allergies  Allergen Reactions  . Propoxyphene Other (See Comments)    fainting  . Ultram [Tramadol] Other (See Comments)    Dizziness, and headaches  . Oxycontin [Oxycodone Hcl] Other (See Comments)    Dizziness    CURRENT MEDICATIONS:  Outpatient Encounter Medications as of 02/21/2020  Medication Sig  . cholestyramine (QUESTRAN) 4 g packet Take 4 g by mouth daily.  Marland Kitchen ibuprofen (ADVIL) 200 MG tablet Take 200 mg by mouth every 6 (six) hours as needed.  Marland Kitchen VITAMIN D PO Take 2 capsules by mouth daily. Pt takes one 5000 iu and one 1000 iu = 6000 iu daily  . [DISCONTINUED] phenazopyridine (PYRIDIUM) 200 MG tablet Take 1 tablet (200 mg total) by mouth 3 (three) times daily as needed for pain. (Patient not taking: Reported on 02/21/2020)   No facility-administered encounter medications on file as of 02/21/2020.     Objective:    PHYSICAL EXAMINATION:  VITALS:   Vitals:   02/21/20 1026  BP: 116/74  Pulse: 65  SpO2: 98%  Weight: 203 lb (92.1 kg)  Height: 5' 9.5" (1.765 m)    GEN:  The patient appears stated age and is in NAD. HEENT:  Normocephalic, atraumatic.  The mucous membranes are moist. The superficial temporal arteries are without ropiness or tenderness. CV:  RRR Lungs:  CTAB Neck/HEME:  There are no carotid bruits bilaterally.  Neurological examination:  Orientation: The patient is alert and oriented x3. Cranial nerves: There is good facial symmetry. The speech is fluent and clear. Soft palate rises symmetrically and there is no tongue deviation. Hearing is intact to conversational  tone. Sensation: Sensation is intact to light touch throughout Motor: Strength is at least antigravity x4.  Movement examination: Tone: There is very mild increased tone in the LUE Abnormal movements: there is LUE rest tremor that increased with distraction.  There is jaw tremor. Coordination:  There is  decremation with RAM's, with finger taps on the L and with heel taps on the L Gait and Station: The patient has mild difficulty arising out of a deep-seated chair without the use of the hands (due to knee issue). The patient's stride length is good.  There is LUE tremor with ambulation I have reviewed and interpreted the following labs independently   Chemistry      Component Value Date/Time   NA 140 10/09/2018 0648   K 4.2 10/09/2018 0648   CL 104 10/09/2018 0648   CO2 22 10/09/2018 0648   BUN 23 10/09/2018 0648   CREATININE 0.90 10/09/2018 0648      Component Value Date/Time   CALCIUM 9.4 10/09/2018 0648   ALKPHOS 94 10/09/2018 0648   AST 31 10/09/2018 0648   ALT 35 10/09/2018 0648   BILITOT 0.5 10/09/2018 0648      Lab Results  Component Value Date   WBC 9.5 10/09/2018   HGB 14.4 10/09/2018   HCT 43.9 10/09/2018   MCV 95.4 10/09/2018   PLT 266 10/09/2018   Lab Results  Component Value Date   TSH 2.33 04/02/2017     Chemistry      Component Value Date/Time   NA 140 10/09/2018 0648   K 4.2 10/09/2018 0648   CL 104 10/09/2018 0648   CO2 22 10/09/2018 0648   BUN 23 10/09/2018 0648   CREATININE 0.90 10/09/2018 0648      Component Value Date/Time   CALCIUM 9.4 10/09/2018 0648   ALKPHOS 94 10/09/2018 0648   AST 31 10/09/2018 0648   ALT 35 10/09/2018 0648   BILITOT 0.5 10/09/2018 0648         Total time spent on today's visit was 40 minutes, including both face-to-face time and nonface-to-face time.  Time included that spent on review of records (prior notes available to me/labs/imaging if pertinent), discussing treatment and goals, answering patient's  questions and coordinating care.  Cc:  Lawerance Cruel, MD

## 2020-02-21 ENCOUNTER — Other Ambulatory Visit: Payer: Self-pay

## 2020-02-21 ENCOUNTER — Encounter: Payer: Self-pay | Admitting: Neurology

## 2020-02-21 ENCOUNTER — Ambulatory Visit: Payer: Medicare PPO | Admitting: Neurology

## 2020-02-21 VITALS — BP 116/74 | HR 65 | Ht 69.5 in | Wt 203.0 lb

## 2020-02-21 DIAGNOSIS — G20 Parkinson's disease: Secondary | ICD-10-CM | POA: Diagnosis not present

## 2020-02-21 MED ORDER — CARBIDOPA-LEVODOPA 25-100 MG PO TABS: 1.0000 | ORAL_TABLET | Freq: Three times a day (TID) | ORAL | 1 refills | Status: AC

## 2020-02-21 NOTE — Patient Instructions (Signed)
Start Carbidopa Levodopa as follows:  Take 1/2 tablet three times daily, at least 30 minutes before meals (approximately 8am/noon/4pm), for one week  Then take 1/2 tablet in the morning, 1/2 tablet in the afternoon, 1 tablet in the evening, at least 30 minutes before meals, for one week  Then take 1/2 tablet in the morning, 1 tablet in the afternoon, 1 tablet in the evening, at least 30 minutes before meals, for one week  Then take 1 tablet three times daily at 8am/noon/4pm, at least 30 minutes before meals   As a reminder, carbidopa/levodopa can be taken at the same time as a carbohydrate, but we like to have you take your pill either 30 minutes before a protein source or 1 hour after as protein can interfere with carbidopa/levodopa absorption.  You may want to seek out a [facility] in your area  You can see if your local physical therapy group has a PT program for Parkinsons Disease (called [facility] or [facility]).   Dr. Jairo Ben Address: [address] [place], [place] [address] Phone: [phone]

## 2020-04-06 DIAGNOSIS — Z8589 Personal history of malignant neoplasm of other organs and systems: Secondary | ICD-10-CM | POA: Diagnosis not present

## 2020-04-06 DIAGNOSIS — Z1231 Encounter for screening mammogram for malignant neoplasm of breast: Secondary | ICD-10-CM | POA: Diagnosis not present

## 2020-04-06 DIAGNOSIS — Z780 Asymptomatic menopausal state: Secondary | ICD-10-CM | POA: Diagnosis not present

## 2020-04-06 DIAGNOSIS — G20 Parkinson's disease: Secondary | ICD-10-CM | POA: Diagnosis not present

## 2020-04-06 DIAGNOSIS — E559 Vitamin D deficiency, unspecified: Secondary | ICD-10-CM | POA: Diagnosis not present

## 2020-04-06 DIAGNOSIS — Z85828 Personal history of other malignant neoplasm of skin: Secondary | ICD-10-CM | POA: Diagnosis not present

## 2020-04-06 DIAGNOSIS — Z136 Encounter for screening for cardiovascular disorders: Secondary | ICD-10-CM | POA: Diagnosis not present

## 2020-04-19 DIAGNOSIS — Z6830 Body mass index (BMI) 30.0-30.9, adult: Secondary | ICD-10-CM | POA: Diagnosis not present

## 2020-04-19 DIAGNOSIS — G20 Parkinson's disease: Secondary | ICD-10-CM | POA: Diagnosis not present

## 2020-04-30 DIAGNOSIS — Z780 Asymptomatic menopausal state: Secondary | ICD-10-CM | POA: Diagnosis not present

## 2020-06-14 DIAGNOSIS — Z0000 Encounter for general adult medical examination without abnormal findings: Secondary | ICD-10-CM | POA: Diagnosis not present

## 2020-06-14 DIAGNOSIS — G20 Parkinson's disease: Secondary | ICD-10-CM | POA: Diagnosis not present

## 2020-07-18 DIAGNOSIS — G20 Parkinson's disease: Secondary | ICD-10-CM | POA: Diagnosis not present

## 2020-07-18 DIAGNOSIS — Z6829 Body mass index (BMI) 29.0-29.9, adult: Secondary | ICD-10-CM | POA: Diagnosis not present

## 2020-07-23 DIAGNOSIS — Z1231 Encounter for screening mammogram for malignant neoplasm of breast: Secondary | ICD-10-CM | POA: Diagnosis not present

## 2020-08-23 DIAGNOSIS — R928 Other abnormal and inconclusive findings on diagnostic imaging of breast: Secondary | ICD-10-CM | POA: Diagnosis not present

## 2020-08-27 DIAGNOSIS — I8393 Asymptomatic varicose veins of bilateral lower extremities: Secondary | ICD-10-CM | POA: Diagnosis not present

## 2020-08-27 DIAGNOSIS — L814 Other melanin hyperpigmentation: Secondary | ICD-10-CM | POA: Diagnosis not present

## 2020-08-27 DIAGNOSIS — D225 Melanocytic nevi of trunk: Secondary | ICD-10-CM | POA: Diagnosis not present

## 2020-08-27 DIAGNOSIS — Z08 Encounter for follow-up examination after completed treatment for malignant neoplasm: Secondary | ICD-10-CM | POA: Diagnosis not present

## 2020-08-27 DIAGNOSIS — D2272 Melanocytic nevi of left lower limb, including hip: Secondary | ICD-10-CM | POA: Diagnosis not present

## 2020-08-27 DIAGNOSIS — Z85820 Personal history of malignant melanoma of skin: Secondary | ICD-10-CM | POA: Diagnosis not present

## 2020-08-27 DIAGNOSIS — D2339 Other benign neoplasm of skin of other parts of face: Secondary | ICD-10-CM | POA: Diagnosis not present

## 2020-08-27 DIAGNOSIS — L821 Other seborrheic keratosis: Secondary | ICD-10-CM | POA: Diagnosis not present

## 2020-08-27 DIAGNOSIS — Z85828 Personal history of other malignant neoplasm of skin: Secondary | ICD-10-CM | POA: Diagnosis not present

## 2020-08-27 DIAGNOSIS — D2271 Melanocytic nevi of right lower limb, including hip: Secondary | ICD-10-CM | POA: Diagnosis not present

## 2020-08-29 DIAGNOSIS — J029 Acute pharyngitis, unspecified: Secondary | ICD-10-CM | POA: Diagnosis not present

## 2020-08-29 DIAGNOSIS — U071 COVID-19: Secondary | ICD-10-CM | POA: Diagnosis not present

## 2020-08-29 DIAGNOSIS — Z1152 Encounter for screening for COVID-19: Secondary | ICD-10-CM | POA: Diagnosis not present

## 2020-10-11 DIAGNOSIS — N3001 Acute cystitis with hematuria: Secondary | ICD-10-CM | POA: Diagnosis not present

## 2020-10-11 DIAGNOSIS — R350 Frequency of micturition: Secondary | ICD-10-CM | POA: Diagnosis not present

## 2020-10-23 DIAGNOSIS — N390 Urinary tract infection, site not specified: Secondary | ICD-10-CM | POA: Diagnosis not present

## 2020-10-23 DIAGNOSIS — R399 Unspecified symptoms and signs involving the genitourinary system: Secondary | ICD-10-CM | POA: Diagnosis not present

## 2020-12-13 DIAGNOSIS — G20 Parkinson's disease: Secondary | ICD-10-CM | POA: Diagnosis not present

## 2021-02-05 DIAGNOSIS — L239 Allergic contact dermatitis, unspecified cause: Secondary | ICD-10-CM | POA: Diagnosis not present

## 2021-02-05 DIAGNOSIS — L538 Other specified erythematous conditions: Secondary | ICD-10-CM | POA: Diagnosis not present

## 2021-02-05 DIAGNOSIS — L298 Other pruritus: Secondary | ICD-10-CM | POA: Diagnosis not present

## 2021-02-05 DIAGNOSIS — R208 Other disturbances of skin sensation: Secondary | ICD-10-CM | POA: Diagnosis not present

## 2021-02-05 DIAGNOSIS — L820 Inflamed seborrheic keratosis: Secondary | ICD-10-CM | POA: Diagnosis not present

## 2021-02-20 DIAGNOSIS — Z1211 Encounter for screening for malignant neoplasm of colon: Secondary | ICD-10-CM | POA: Diagnosis not present

## 2021-02-20 DIAGNOSIS — K9089 Other intestinal malabsorption: Secondary | ICD-10-CM | POA: Diagnosis not present

## 2021-03-04 DIAGNOSIS — D2372 Other benign neoplasm of skin of left lower limb, including hip: Secondary | ICD-10-CM | POA: Diagnosis not present

## 2021-03-04 DIAGNOSIS — Z85828 Personal history of other malignant neoplasm of skin: Secondary | ICD-10-CM | POA: Diagnosis not present

## 2021-03-04 DIAGNOSIS — L821 Other seborrheic keratosis: Secondary | ICD-10-CM | POA: Diagnosis not present

## 2021-03-04 DIAGNOSIS — Z08 Encounter for follow-up examination after completed treatment for malignant neoplasm: Secondary | ICD-10-CM | POA: Diagnosis not present

## 2021-03-04 DIAGNOSIS — Z85820 Personal history of malignant melanoma of skin: Secondary | ICD-10-CM | POA: Diagnosis not present

## 2021-03-04 DIAGNOSIS — L239 Allergic contact dermatitis, unspecified cause: Secondary | ICD-10-CM | POA: Diagnosis not present

## 2021-03-04 DIAGNOSIS — L814 Other melanin hyperpigmentation: Secondary | ICD-10-CM | POA: Diagnosis not present

## 2021-03-04 DIAGNOSIS — I8393 Asymptomatic varicose veins of bilateral lower extremities: Secondary | ICD-10-CM | POA: Diagnosis not present

## 2021-04-15 DIAGNOSIS — H6693 Otitis media, unspecified, bilateral: Secondary | ICD-10-CM | POA: Diagnosis not present

## 2021-04-15 DIAGNOSIS — U071 COVID-19: Secondary | ICD-10-CM | POA: Diagnosis not present

## 2021-04-19 DIAGNOSIS — R0989 Other specified symptoms and signs involving the circulatory and respiratory systems: Secondary | ICD-10-CM | POA: Diagnosis not present

## 2021-04-19 DIAGNOSIS — U071 COVID-19: Secondary | ICD-10-CM | POA: Diagnosis not present
